# Patient Record
Sex: Female | Born: 1960 | Race: White | Hispanic: No | Marital: Married | Smoking: Never smoker
Health system: Southern US, Community
[De-identification: ages and names within clinical notes are randomized; demographics above are authoritative.]

## PROBLEM LIST (undated history)

## (undated) DIAGNOSIS — N809 Endometriosis, unspecified: Secondary | ICD-10-CM

## (undated) DIAGNOSIS — K5792 Diverticulitis of intestine, part unspecified, without perforation or abscess without bleeding: Secondary | ICD-10-CM

## (undated) DIAGNOSIS — K219 Gastro-esophageal reflux disease without esophagitis: Secondary | ICD-10-CM

## (undated) DIAGNOSIS — M1990 Unspecified osteoarthritis, unspecified site: Secondary | ICD-10-CM

## (undated) DIAGNOSIS — G43909 Migraine, unspecified, not intractable, without status migrainosus: Secondary | ICD-10-CM

## (undated) DIAGNOSIS — J301 Allergic rhinitis due to pollen: Secondary | ICD-10-CM

## (undated) HISTORY — PX: BREAST EXCISIONAL BIOPSY: SUR124

## (undated) HISTORY — DX: Unspecified osteoarthritis, unspecified site: M19.90

## (undated) HISTORY — DX: Migraine, unspecified, not intractable, without status migrainosus: G43.909

## (undated) HISTORY — DX: Diverticulitis of intestine, part unspecified, without perforation or abscess without bleeding: K57.92

## (undated) HISTORY — PX: APPENDECTOMY: SHX54

## (undated) HISTORY — DX: Endometriosis, unspecified: N80.9

## (undated) HISTORY — PX: CHOLECYSTECTOMY: SHX55

## (undated) HISTORY — PX: OTHER SURGICAL HISTORY: SHX169

## (undated) HISTORY — DX: Allergic rhinitis due to pollen: J30.1

## (undated) HISTORY — PX: ABDOMINAL HYSTERECTOMY: SHX81

---

## 1984-04-10 HISTORY — PX: APPENDECTOMY: SHX54

## 1986-04-10 HISTORY — PX: EXPLORATORY LAPAROTOMY: SUR591

## 1996-04-10 HISTORY — PX: ABDOMINAL HYSTERECTOMY: SHX81

## 1997-06-25 ENCOUNTER — Other Ambulatory Visit: Admission: RE | Admit: 1997-06-25 | Discharge: 1997-06-25 | Payer: Self-pay | Admitting: Obstetrics and Gynecology

## 1997-07-16 ENCOUNTER — Ambulatory Visit (HOSPITAL_COMMUNITY): Admission: RE | Admit: 1997-07-16 | Discharge: 1997-07-16 | Payer: Self-pay | Admitting: Obstetrics and Gynecology

## 1998-06-29 ENCOUNTER — Other Ambulatory Visit: Admission: RE | Admit: 1998-06-29 | Discharge: 1998-06-29 | Payer: Self-pay | Admitting: Obstetrics and Gynecology

## 1999-02-04 ENCOUNTER — Encounter: Payer: Self-pay | Admitting: Family Medicine

## 1999-02-04 ENCOUNTER — Encounter: Admission: RE | Admit: 1999-02-04 | Discharge: 1999-02-04 | Payer: Self-pay | Admitting: Family Medicine

## 1999-03-23 ENCOUNTER — Encounter: Admission: RE | Admit: 1999-03-23 | Discharge: 1999-03-23 | Payer: Self-pay | Admitting: Family Medicine

## 1999-03-23 ENCOUNTER — Encounter: Payer: Self-pay | Admitting: Family Medicine

## 1999-07-01 ENCOUNTER — Other Ambulatory Visit: Admission: RE | Admit: 1999-07-01 | Discharge: 1999-07-01 | Payer: Self-pay | Admitting: Obstetrics and Gynecology

## 1999-08-11 ENCOUNTER — Encounter: Payer: Self-pay | Admitting: Gastroenterology

## 1999-08-11 ENCOUNTER — Encounter: Admission: RE | Admit: 1999-08-11 | Discharge: 1999-08-11 | Payer: Self-pay | Admitting: Gastroenterology

## 2000-08-02 ENCOUNTER — Encounter: Payer: Self-pay | Admitting: Obstetrics and Gynecology

## 2000-08-02 ENCOUNTER — Encounter: Admission: RE | Admit: 2000-08-02 | Discharge: 2000-08-02 | Payer: Self-pay | Admitting: Obstetrics and Gynecology

## 2000-11-02 ENCOUNTER — Encounter: Admission: RE | Admit: 2000-11-02 | Discharge: 2000-11-02 | Payer: Self-pay | Admitting: Internal Medicine

## 2000-11-02 ENCOUNTER — Encounter: Payer: Self-pay | Admitting: Internal Medicine

## 2001-06-03 ENCOUNTER — Encounter: Payer: Self-pay | Admitting: Obstetrics and Gynecology

## 2001-06-03 ENCOUNTER — Encounter: Admission: RE | Admit: 2001-06-03 | Discharge: 2001-06-03 | Payer: Self-pay | Admitting: Obstetrics and Gynecology

## 2001-06-06 ENCOUNTER — Encounter: Payer: Self-pay | Admitting: Family Medicine

## 2001-06-06 ENCOUNTER — Encounter: Admission: RE | Admit: 2001-06-06 | Discharge: 2001-06-06 | Payer: Self-pay | Admitting: Family Medicine

## 2001-08-07 ENCOUNTER — Encounter: Admission: RE | Admit: 2001-08-07 | Discharge: 2001-08-07 | Payer: Self-pay | Admitting: Obstetrics and Gynecology

## 2001-08-07 ENCOUNTER — Encounter: Payer: Self-pay | Admitting: Obstetrics and Gynecology

## 2001-08-15 ENCOUNTER — Ambulatory Visit (HOSPITAL_COMMUNITY): Admission: RE | Admit: 2001-08-15 | Discharge: 2001-08-15 | Payer: Self-pay | Admitting: Obstetrics and Gynecology

## 2001-08-15 ENCOUNTER — Encounter: Payer: Self-pay | Admitting: Obstetrics and Gynecology

## 2002-08-19 ENCOUNTER — Encounter: Admission: RE | Admit: 2002-08-19 | Discharge: 2002-08-19 | Payer: Self-pay | Admitting: Obstetrics and Gynecology

## 2002-08-19 ENCOUNTER — Encounter: Payer: Self-pay | Admitting: Obstetrics and Gynecology

## 2002-11-26 ENCOUNTER — Encounter (INDEPENDENT_AMBULATORY_CARE_PROVIDER_SITE_OTHER): Payer: Self-pay | Admitting: *Deleted

## 2002-11-26 ENCOUNTER — Ambulatory Visit (HOSPITAL_BASED_OUTPATIENT_CLINIC_OR_DEPARTMENT_OTHER): Admission: RE | Admit: 2002-11-26 | Discharge: 2002-11-26 | Payer: Self-pay | Admitting: *Deleted

## 2003-04-11 HISTORY — PX: BREAST EXCISIONAL BIOPSY: SUR124

## 2003-08-18 ENCOUNTER — Emergency Department (HOSPITAL_COMMUNITY): Admission: EM | Admit: 2003-08-18 | Discharge: 2003-08-19 | Payer: Self-pay | Admitting: Emergency Medicine

## 2003-09-08 ENCOUNTER — Other Ambulatory Visit: Admission: RE | Admit: 2003-09-08 | Discharge: 2003-09-08 | Payer: Self-pay | Admitting: Obstetrics and Gynecology

## 2003-09-11 ENCOUNTER — Encounter: Admission: RE | Admit: 2003-09-11 | Discharge: 2003-09-11 | Payer: Self-pay | Admitting: Obstetrics and Gynecology

## 2004-11-04 ENCOUNTER — Other Ambulatory Visit: Admission: RE | Admit: 2004-11-04 | Discharge: 2004-11-04 | Payer: Self-pay | Admitting: Obstetrics and Gynecology

## 2004-11-15 ENCOUNTER — Ambulatory Visit (HOSPITAL_COMMUNITY): Admission: RE | Admit: 2004-11-15 | Discharge: 2004-11-15 | Payer: Self-pay | Admitting: Obstetrics and Gynecology

## 2004-11-24 ENCOUNTER — Encounter: Admission: RE | Admit: 2004-11-24 | Discharge: 2004-11-24 | Payer: Self-pay | Admitting: Obstetrics and Gynecology

## 2005-01-31 ENCOUNTER — Encounter: Admission: RE | Admit: 2005-01-31 | Discharge: 2005-01-31 | Payer: Self-pay | Admitting: *Deleted

## 2005-12-21 ENCOUNTER — Other Ambulatory Visit: Admission: RE | Admit: 2005-12-21 | Discharge: 2005-12-21 | Payer: Self-pay | Admitting: Obstetrics and Gynecology

## 2005-12-27 ENCOUNTER — Encounter: Admission: RE | Admit: 2005-12-27 | Discharge: 2005-12-27 | Payer: Self-pay | Admitting: Obstetrics and Gynecology

## 2006-12-31 ENCOUNTER — Encounter: Admission: RE | Admit: 2006-12-31 | Discharge: 2006-12-31 | Payer: Self-pay | Admitting: Obstetrics and Gynecology

## 2007-01-07 ENCOUNTER — Other Ambulatory Visit: Admission: RE | Admit: 2007-01-07 | Discharge: 2007-01-07 | Payer: Self-pay | Admitting: Obstetrics and Gynecology

## 2007-05-24 ENCOUNTER — Encounter: Admission: RE | Admit: 2007-05-24 | Discharge: 2007-05-24 | Payer: Self-pay | Admitting: Orthopaedic Surgery

## 2007-05-26 ENCOUNTER — Encounter: Admission: RE | Admit: 2007-05-26 | Discharge: 2007-05-26 | Payer: Self-pay | Admitting: Orthopaedic Surgery

## 2007-08-09 HISTORY — PX: OVARIAN CYST REMOVAL: SHX89

## 2007-08-27 ENCOUNTER — Ambulatory Visit (HOSPITAL_BASED_OUTPATIENT_CLINIC_OR_DEPARTMENT_OTHER): Admission: RE | Admit: 2007-08-27 | Discharge: 2007-08-27 | Payer: Self-pay | Admitting: Orthopaedic Surgery

## 2008-01-06 ENCOUNTER — Encounter: Admission: RE | Admit: 2008-01-06 | Discharge: 2008-01-06 | Payer: Self-pay | Admitting: Obstetrics and Gynecology

## 2008-01-15 ENCOUNTER — Other Ambulatory Visit: Admission: RE | Admit: 2008-01-15 | Discharge: 2008-01-15 | Payer: Self-pay | Admitting: Obstetrics and Gynecology

## 2008-04-10 HISTORY — PX: CHOLECYSTECTOMY: SHX55

## 2008-09-25 ENCOUNTER — Ambulatory Visit (HOSPITAL_BASED_OUTPATIENT_CLINIC_OR_DEPARTMENT_OTHER): Admission: RE | Admit: 2008-09-25 | Discharge: 2008-09-25 | Payer: Self-pay | Admitting: Family Medicine

## 2008-09-25 ENCOUNTER — Ambulatory Visit: Payer: Self-pay | Admitting: Diagnostic Radiology

## 2009-01-18 ENCOUNTER — Encounter: Admission: RE | Admit: 2009-01-18 | Discharge: 2009-01-18 | Payer: Self-pay | Admitting: Obstetrics and Gynecology

## 2009-01-20 ENCOUNTER — Other Ambulatory Visit
Admission: RE | Admit: 2009-01-20 | Discharge: 2009-01-20 | Payer: Self-pay | Admitting: Physical Medicine & Rehabilitation

## 2009-06-06 ENCOUNTER — Ambulatory Visit: Payer: Self-pay | Admitting: Interventional Radiology

## 2009-06-06 ENCOUNTER — Emergency Department (HOSPITAL_BASED_OUTPATIENT_CLINIC_OR_DEPARTMENT_OTHER): Admission: EM | Admit: 2009-06-06 | Discharge: 2009-06-07 | Payer: Self-pay | Admitting: Emergency Medicine

## 2009-06-07 ENCOUNTER — Ambulatory Visit: Payer: Self-pay | Admitting: Radiology

## 2009-06-08 DIAGNOSIS — K219 Gastro-esophageal reflux disease without esophagitis: Secondary | ICD-10-CM

## 2009-06-08 HISTORY — DX: Gastro-esophageal reflux disease without esophagitis: K21.9

## 2009-06-08 HISTORY — PX: ESOPHAGOGASTRODUODENOSCOPY: SHX1529

## 2009-06-17 ENCOUNTER — Ambulatory Visit (HOSPITAL_COMMUNITY): Admission: RE | Admit: 2009-06-17 | Discharge: 2009-06-17 | Payer: Self-pay | Admitting: Gastroenterology

## 2009-06-30 ENCOUNTER — Ambulatory Visit (HOSPITAL_COMMUNITY): Admission: RE | Admit: 2009-06-30 | Discharge: 2009-06-30 | Payer: Self-pay | Admitting: Gastroenterology

## 2009-07-26 ENCOUNTER — Encounter (INDEPENDENT_AMBULATORY_CARE_PROVIDER_SITE_OTHER): Payer: Self-pay | Admitting: General Surgery

## 2009-07-26 ENCOUNTER — Ambulatory Visit (HOSPITAL_COMMUNITY): Admission: RE | Admit: 2009-07-26 | Discharge: 2009-07-27 | Payer: Self-pay | Admitting: General Surgery

## 2010-01-19 ENCOUNTER — Encounter: Admission: RE | Admit: 2010-01-19 | Discharge: 2010-01-19 | Payer: Self-pay | Admitting: Obstetrics and Gynecology

## 2010-05-01 ENCOUNTER — Encounter: Payer: Self-pay | Admitting: Orthopaedic Surgery

## 2010-05-26 ENCOUNTER — Emergency Department (HOSPITAL_BASED_OUTPATIENT_CLINIC_OR_DEPARTMENT_OTHER)
Admission: EM | Admit: 2010-05-26 | Discharge: 2010-05-26 | Disposition: A | Discharge status: Home / Self Care | Payer: BC Managed Care – PPO | Attending: Emergency Medicine | Admitting: Emergency Medicine

## 2010-05-26 ENCOUNTER — Emergency Department (INDEPENDENT_AMBULATORY_CARE_PROVIDER_SITE_OTHER): Payer: BC Managed Care – PPO

## 2010-05-26 DIAGNOSIS — W1789XA Other fall from one level to another, initial encounter: Secondary | ICD-10-CM | POA: Insufficient documentation

## 2010-05-26 DIAGNOSIS — G8929 Other chronic pain: Secondary | ICD-10-CM | POA: Insufficient documentation

## 2010-05-26 DIAGNOSIS — M25539 Pain in unspecified wrist: Secondary | ICD-10-CM

## 2010-05-26 DIAGNOSIS — W172XXA Fall into hole, initial encounter: Secondary | ICD-10-CM

## 2010-05-26 DIAGNOSIS — M25529 Pain in unspecified elbow: Secondary | ICD-10-CM

## 2010-05-26 DIAGNOSIS — M549 Dorsalgia, unspecified: Secondary | ICD-10-CM | POA: Insufficient documentation

## 2010-05-26 DIAGNOSIS — S63509A Unspecified sprain of unspecified wrist, initial encounter: Secondary | ICD-10-CM | POA: Insufficient documentation

## 2010-06-29 LAB — LIPASE, BLOOD: Lipase: 45 U/L (ref 23–300)

## 2010-06-29 LAB — CBC
Hemoglobin: 14.4 g/dL (ref 12.0–15.0)
MCV: 91.4 fL (ref 78.0–100.0)
Platelets: 215 10*3/uL (ref 150–400)
Platelets: 203 10*3/uL (ref 150–400)

## 2010-06-29 LAB — COMPREHENSIVE METABOLIC PANEL
ALT: 16 U/L (ref 0–35)
ALT: 26 U/L (ref 0–35)
AST: 16 U/L (ref 0–37)
AST: 31 U/L (ref 0–37)
Albumin: 4 g/dL (ref 3.5–5.2)
Alkaline Phosphatase: 57 U/L (ref 39–117)
BUN: 10 mg/dL (ref 6–23)
Calcium: 9 mg/dL (ref 8.4–10.5)
Chloride: 107 mEq/L (ref 96–112)
Chloride: 105 mEq/L (ref 96–112)
Creatinine, Ser: 0.79 mg/dL (ref 0.4–1.2)
GFR calc Af Amer: >60 mL/min (ref >60)
GFR calc Af Amer: >60 mL/min (ref >60)
Potassium: 4 mEq/L (ref 3.5–5.1)
Potassium: 4.2 mEq/L (ref 3.5–5.1)
Sodium: 141 mEq/L (ref 135–145)
Total Bilirubin: 0.5 mg/dL (ref 0.3–1.2)
Total Bilirubin: 0.5 mg/dL (ref 0.3–1.2)

## 2010-06-29 LAB — DIFFERENTIAL
Eosinophils Relative: 1 % (ref 0–5)
Lymphocytes Relative: 29 % (ref 12–46)
Neutrophils Relative %: 61 % (ref 43–77)

## 2010-06-29 LAB — URINALYSIS, ROUTINE W REFLEX MICROSCOPIC
Bilirubin Urine: NEGATIVE
Glucose, UA: NEGATIVE mg/dL
Nitrite: NEGATIVE
Specific Gravity, Urine: 1.015 (ref 1.005–1.030)

## 2010-08-23 NOTE — Op Note (Signed)
Joanne [name], Joanne [name]                 ACCOUNT NO.:  [id number]   MEDICAL RECORD NO.:  [id number]          PATIENT TYPE:  AMB   LOCATION:  DSC                          FACILITY:  [facility]   PHYSICIAN:  Lubertha Basque. Dalldorf, M.D.DATE OF BIRTH:  [birth year]   DATE OF PROCEDURE:  08/27/2007  DATE OF DISCHARGE:                               OPERATIVE REPORT   PREOPERATIVE DIAGNOSIS:  Left middle finger flexor tendon sheath cyst.   POSTOPERATIVE DIAGNOSIS:  Left middle finger flexor tendon sheath cyst.   PROCEDURE:  Excision, left middle finger flexor tendon sheath cyst.   ANESTHESIA:  Bier block and MAC.   ATTENDING SURGEON:  Lubertha Basque. Jerl Santos, MD.   ASSISTANTLindwood Qua, PA.   INDICATIONS FOR PROCEDURE:  The patient is a 50 year old woman with a  long history of a cyst at the base of her left middle finger.  This made  grasping things difficult as it was directly central at her MCP joint.  This persisted despite oral anti-inflammatories and rest.  She is  offered excision.  Informed operative consent was obtained after  discussion of possible complications of reaction to anesthesia,  infection, neurovascular injury, and recurrence.   SUMMARY, FINDINGS, AND PROCEDURE:  Under Bier block and MAC through a  small central incision, an 8-mm diameter fluid-filled cyst was removed  from the volar aspect of the left middle finger MCP region.   DESCRIPTION OF PROCEDURE:  The patient was taken to an operating suite  where Bier block and MAC were applied.  She was positioned supine and  prepped and draped in normal sterile fashion.  After administration of  IV Kefzol, a small palmar incision was made from the MCP joint proximal.  This did not cross the flexion crease at the MCP joint.  Dissection was  carried down to the flexor tendon sheath.  She did have an 8 or 9-mm  fluid-filled cyst adherent to the flexor tendon sheath.  This was  removed along with the tiny portion of the underlying  flexor tendon  sheath.  The cyst was removed intact.  This was taken to a back table,  and cut open, and gelatinous fluid emanated.  This was consistent with a  benign ganglion cyst and we did not send the cyst to pathology.  The  underlying pulley remained intact.  The wound was irrigated followed by  reapproximation of the skin with nylon.  The tourniquet was deflated and  the fingertip became pink and warm immediately.  A dry gauze dressing  was applied along with a loose Ace wrap.  Estimated blood loss,  intraoperative fluids, as well as accurate tourniquet time could be  obtained from anesthesia records.   DISPOSITION:  The patient was taken to recovery room in stable addition.  She was to go home same day and follow up in the office next week.  I  will contact her by phone tonight.      Lubertha Basque Jerl Santos, M.D.  Electronically Signed     PGD/MEDQ  D:  08/27/2007  T:  08/28/2007  Job:  [id number]

## 2010-08-26 NOTE — Op Note (Signed)
   NAME:  Joanne [name], Joanne [name]                           ACCOUNT NO.:  [id number]   MEDICAL RECORD NO.:  [id number]                   PATIENT TYPE:  AMB   LOCATION:  DSC                                  FACILITY:  [facility]   PHYSICIAN:  Vikki Ports, M.D.         DATE OF BIRTH:  [birth year]   DATE OF PROCEDURE:  11/26/2002  DATE OF DISCHARGE:                                 OPERATIVE REPORT   PREOPERATIVE DIAGNOSIS:  Right breast mass.   POSTOPERATIVE DIAGNOSIS:  Right breast mass.   PROCEDURE:  Excisional right breast biopsy.   SURGEON:  Vikki Ports, M.D.   ANESTHESIA:  Local MAC.   DESCRIPTION OF PROCEDURE:  The patient was taken to the operating room and  placed in the supine position.  After adequate anesthesia was induced using  MAC technique, the right breast was prepped and draped in the normal sterile  fashion.  Using a 1% lidocaine local anesthesia, the skin overlying the 10  o'clock region of the right breast was anesthetized.  A curvilinear incision  was made and dissected down onto firm fibrotic tissue, which was excised in  its entirety.  Adequate hemostasis of the cavity was ensured.  The skin was  closed with subcuticular 4-0 Monocryl, Steri-Strips and sterile dressings  were applied.  The patient tolerated the procedure well and went to PACU in  good condition.                                               Vikki Ports, M.D.    KRH/MEDQ  D:  11/26/2002  T:  11/26/2002  Job:  [id number]

## 2010-12-19 ENCOUNTER — Other Ambulatory Visit: Payer: Self-pay | Admitting: Obstetrics and Gynecology

## 2010-12-19 DIAGNOSIS — Z1231 Encounter for screening mammogram for malignant neoplasm of breast: Secondary | ICD-10-CM

## 2011-01-04 LAB — POCT HEMOGLOBIN-HEMACUE: Hemoglobin: 15

## 2011-01-27 ENCOUNTER — Ambulatory Visit: Payer: BC Managed Care – PPO

## 2011-02-07 ENCOUNTER — Ambulatory Visit: Payer: BC Managed Care – PPO

## 2011-02-14 ENCOUNTER — Ambulatory Visit
Admission: RE | Admit: 2011-02-14 | Discharge: 2011-02-14 | Disposition: A | Discharge status: Home / Self Care | Payer: BC Managed Care – PPO | Source: Ambulatory Visit | Attending: Obstetrics and Gynecology | Admitting: Obstetrics and Gynecology

## 2011-02-14 DIAGNOSIS — Z1231 Encounter for screening mammogram for malignant neoplasm of breast: Secondary | ICD-10-CM

## 2011-02-21 ENCOUNTER — Other Ambulatory Visit: Payer: Self-pay | Admitting: Obstetrics and Gynecology

## 2011-04-13 IMAGING — MG MM DIGITAL SCREENING BILATERAL
4 series · 4 of 4 positions shown · non-contrast
Comparison: none

DG SCREEN MAMMOGRAM BILATERAL
Bilateral CC and MLO view(s) were taken.

DIGITAL SCREENING MAMMOGRAM WITH CAD:
The breast tissue is extremely dense.  No masses or malignant type calcifications are identified.  
Compared with prior studies.
Images were processed with CAD.

[R CC]
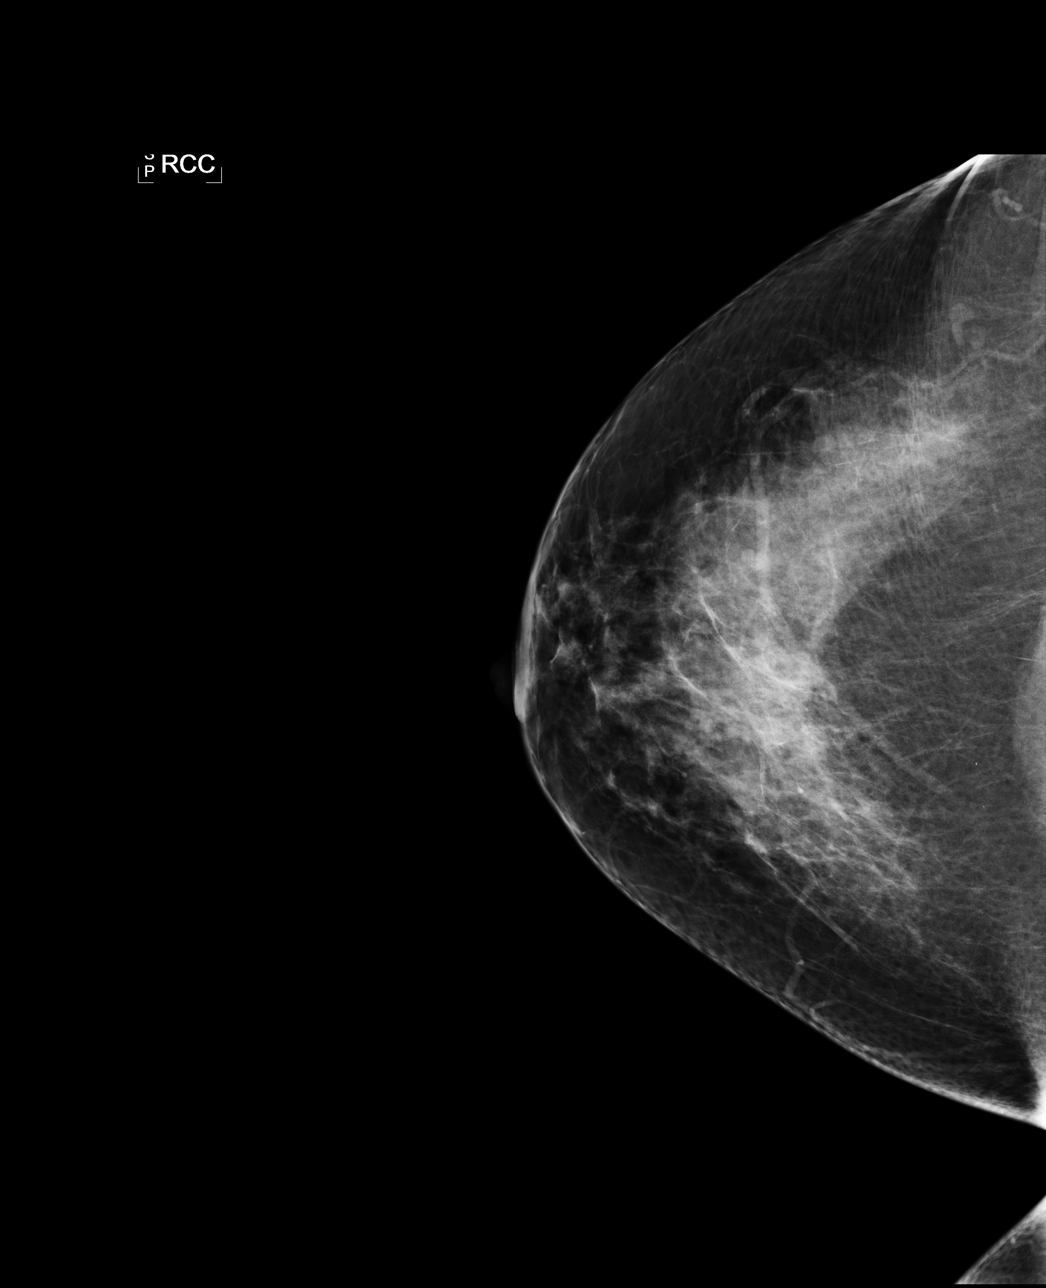

[L CC]
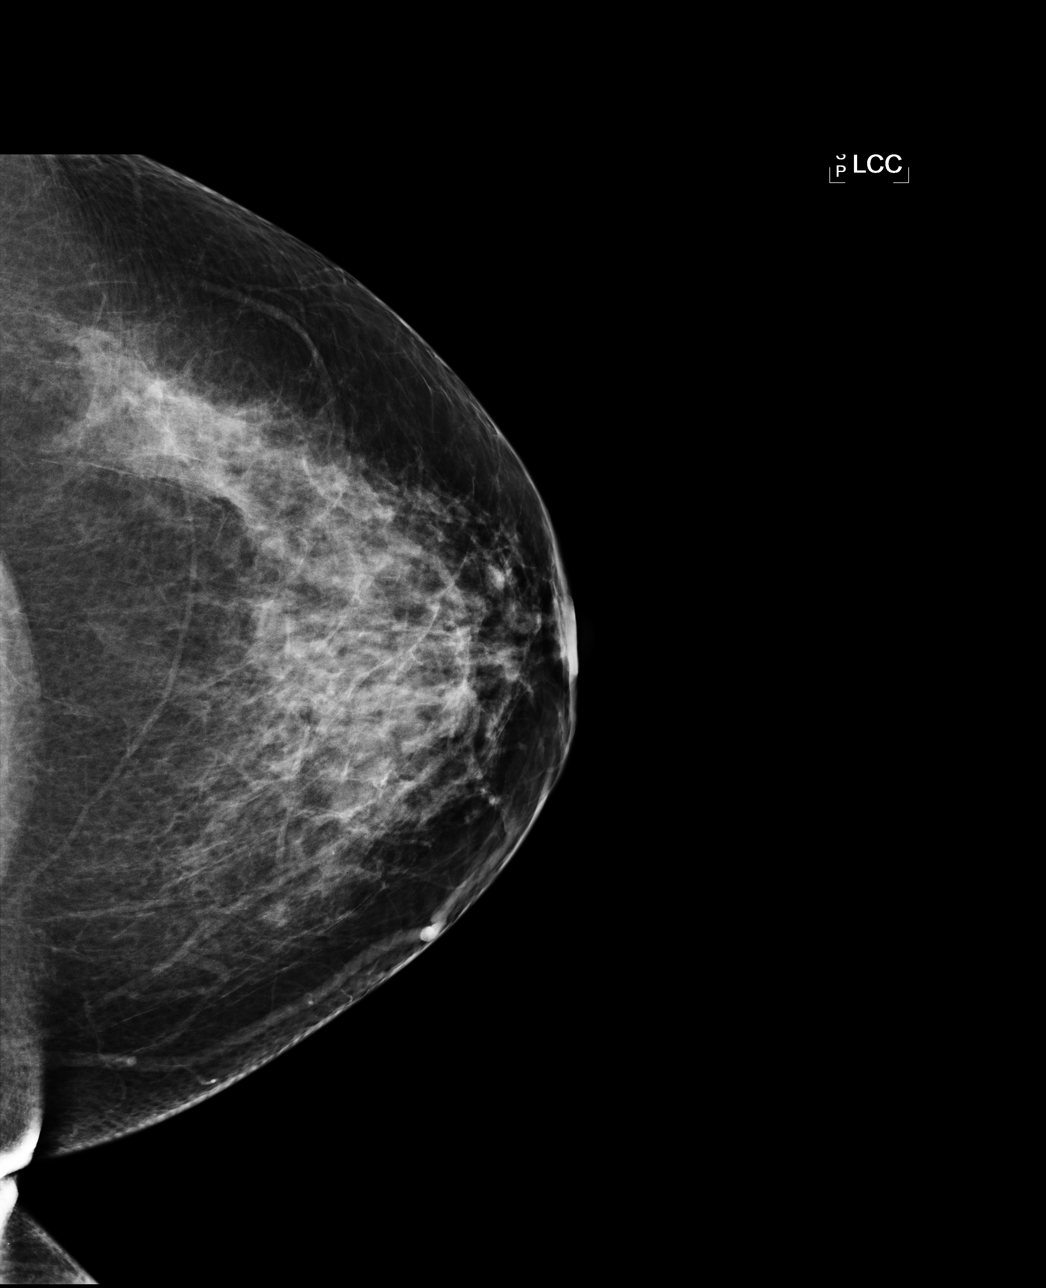

[L MLO]
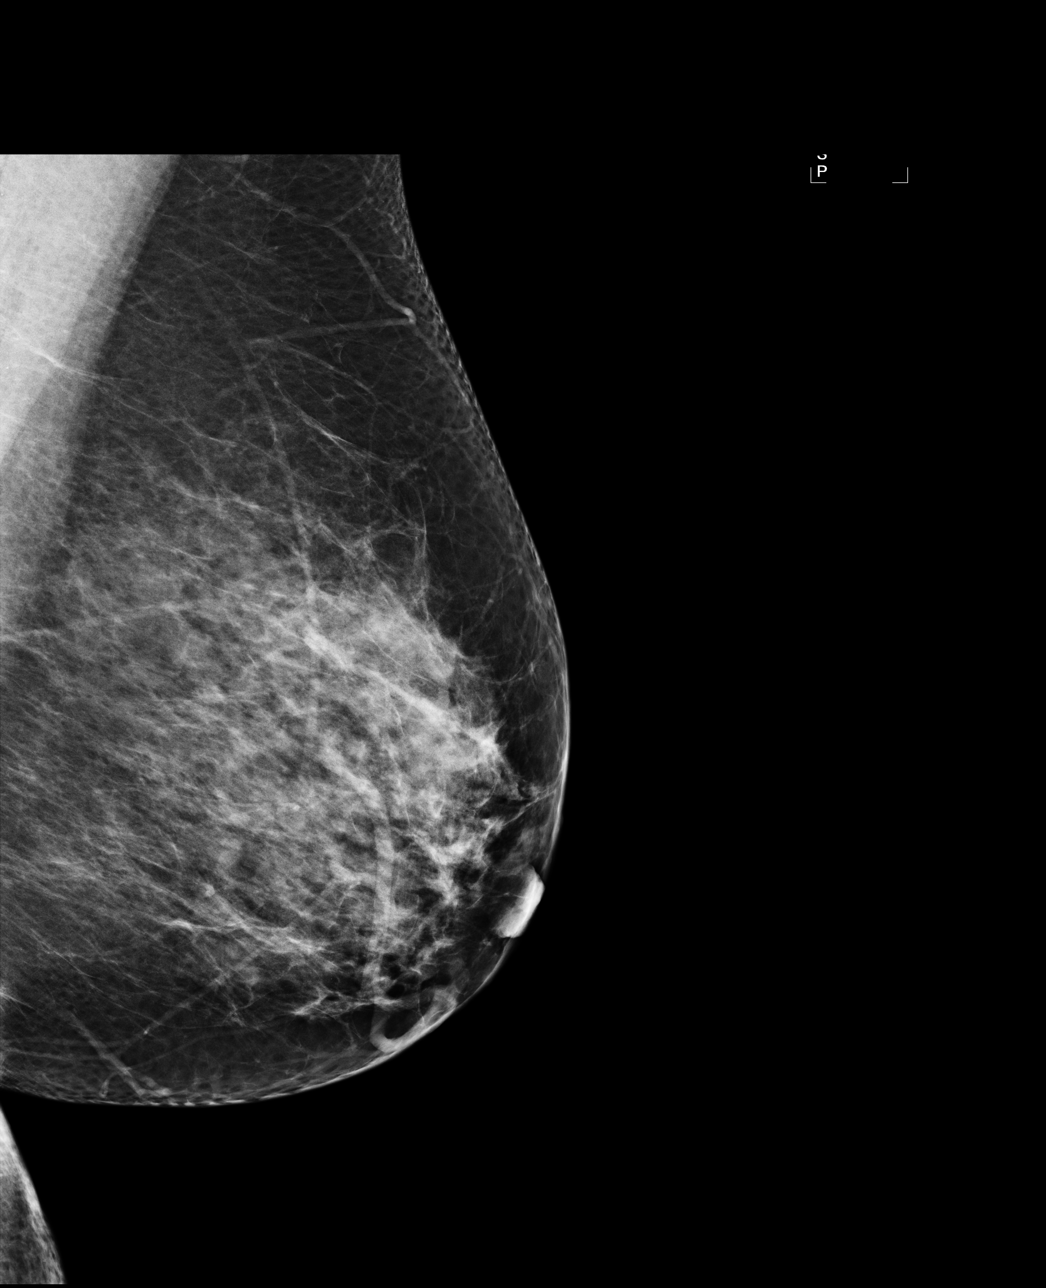

[R MLO]
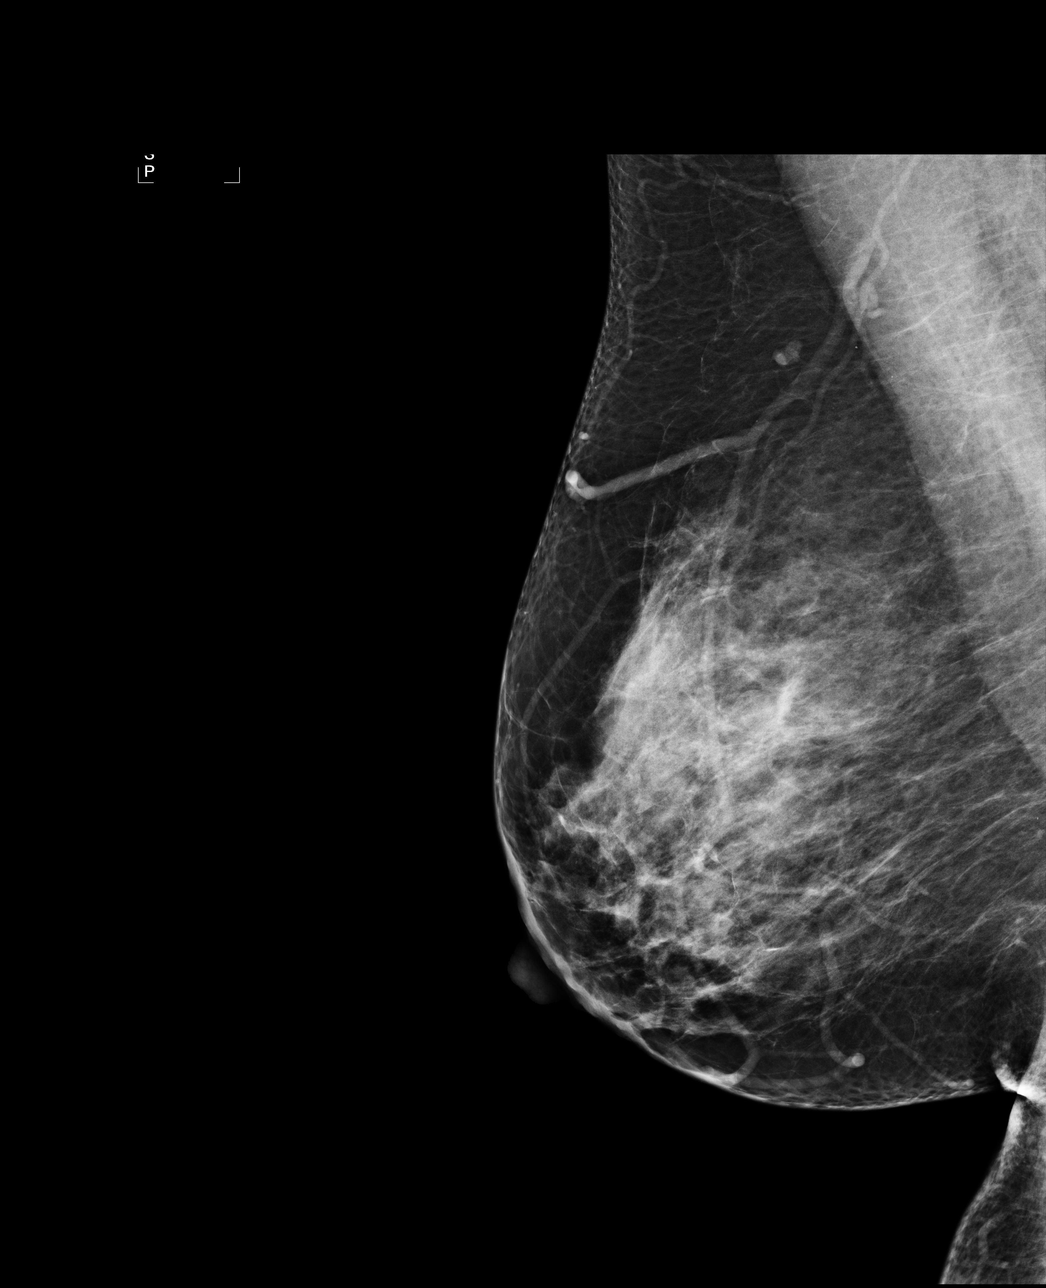

[4 of 4 positions shown; findings below may reference images not displayed]

IMPRESSION: No specific mammographic evidence of malignancy.  Next screening mammogram is recommended in one 
year.

A result letter of this screening mammogram will be mailed directly to the patient.

ASSESSMENT: Negative - BI-RADS 1

Screening mammogram in 1 year.
,

## 2011-09-26 ENCOUNTER — Emergency Department (HOSPITAL_BASED_OUTPATIENT_CLINIC_OR_DEPARTMENT_OTHER): Payer: BC Managed Care – PPO

## 2011-09-26 ENCOUNTER — Encounter (HOSPITAL_BASED_OUTPATIENT_CLINIC_OR_DEPARTMENT_OTHER): Payer: Self-pay

## 2011-09-26 ENCOUNTER — Emergency Department (HOSPITAL_BASED_OUTPATIENT_CLINIC_OR_DEPARTMENT_OTHER)
Admission: EM | Admit: 2011-09-26 | Discharge: 2011-09-26 | Disposition: A | Discharge status: Home / Self Care | Payer: BC Managed Care – PPO | Attending: Emergency Medicine | Admitting: Emergency Medicine

## 2011-09-26 DIAGNOSIS — W010XXA Fall on same level from slipping, tripping and stumbling without subsequent striking against object, initial encounter: Secondary | ICD-10-CM | POA: Insufficient documentation

## 2011-09-26 DIAGNOSIS — IMO0002 Reserved for concepts with insufficient information to code with codable children: Secondary | ICD-10-CM | POA: Insufficient documentation

## 2011-09-26 DIAGNOSIS — M25559 Pain in unspecified hip: Secondary | ICD-10-CM | POA: Insufficient documentation

## 2011-09-26 DIAGNOSIS — S76319A Strain of muscle, fascia and tendon of the posterior muscle group at thigh level, unspecified thigh, initial encounter: Secondary | ICD-10-CM

## 2011-09-26 DIAGNOSIS — M79609 Pain in unspecified limb: Secondary | ICD-10-CM | POA: Insufficient documentation

## 2011-09-26 HISTORY — DX: Gastro-esophageal reflux disease without esophagitis: K21.9

## 2011-09-26 MED ORDER — HYDROMORPHONE HCL PF 1 MG/ML IJ SOLN
1.0000 mg | Freq: Once | INTRAMUSCULAR | Status: AC
  Administered 2011-09-26: 1 mg via INTRAVENOUS
  Filled 2011-09-26: qty 1

## 2011-09-26 MED ORDER — IBUPROFEN 600 MG PO TABS: 600.0000 mg | ORAL_TABLET | Freq: Four times a day (QID) | ORAL | Status: AC | PRN

## 2011-09-26 MED ORDER — POLYETHYLENE GLYCOL 3350 17 GM/SCOOP PO POWD: 17.0000 g | Freq: Every day | ORAL | Status: AC

## 2011-09-26 MED ORDER — HYDROMORPHONE HCL PF 1 MG/ML IJ SOLN
1.0000 mg | INTRAMUSCULAR | Status: DC | PRN
  Administered 2011-09-26 (×2): 1 mg via INTRAVENOUS
  Filled 2011-09-26 (×2): qty 1

## 2011-09-26 MED ORDER — OXYCODONE-ACETAMINOPHEN 10-325 MG PO TABS: 1.0000 | ORAL_TABLET | ORAL | Status: AC | PRN

## 2011-09-26 MED ORDER — KETOROLAC TROMETHAMINE 30 MG/ML IJ SOLN
30.0000 mg | Freq: Once | INTRAMUSCULAR | Status: AC
  Administered 2011-09-26: 30 mg via INTRAVENOUS
  Filled 2011-09-26: qty 1

## 2011-09-26 NOTE — ED Notes (Signed)
Patient transported to X-ray via stretcher 

## 2011-09-26 NOTE — ED Notes (Signed)
Pt returned from MRI °

## 2011-09-26 NOTE — Discharge Instructions (Signed)
You have torn the hamstring from the ischium. Treatment is supportive: pain control and activity as tolerated. You may follow up with the orthopedic doctor in 1-2 weeks. Make sure to take miralax or stool softener while on pain medication.   Hamstring Strain  Hamstrings are the large muscles in the back of the thighs. A strain or tear injury happens when there is a sudden stretch or pull on these muscles and tendons. Tendons are cord like structures that attach muscle to bone. These injuries are commonly seen in activities such as sprinting due to sudden acceleration.  DIAGNOSIS  Often the diagnosis can be made by examination. HOME CARE INSTRUCTIONS   Apply ice to the sore area for 15 to , 3 to 4 times per day. Do this while awake for the first 2 days. Put the ice in a plastic bag, and place a towel between the bag of ice and your skin.   Keep your knee flexed when possible. This means your foot is held off the ground slightly if you are on crutches. When lying down, a pillow under the knee will take strain off the muscles and provide some relief.   If a compression bandage such as an ace wrap was applied, use it until you are seen again. You may remove it for sleeping, showers and baths. If the wrap seems to be too tight and is uncomfortable, wrap it more loosely. If your toes or foot are getting cold or blue, it is too tight.   Walk or move around as the pain allows, or as instructed. Resume full activities as suggested by your caregiver. This is often safest when the strength of the injured leg has nearly returned to normal.   Only take over-the-counter or prescription medicines for pain, discomfort, or fever as directed by your caregiver.  SEEK MEDICAL CARE IF:   You have an increase in bruising, swelling or pain.   You notice coldness or blueness of your toes or foot.   Pain relief is not obtained with medications.   You have increasing pain in the area and seem to be  getting worse rather than better.   You notice your thigh getting larger in size (this could indicate bleeding into the muscle).  Document Released: 12/20/2000 Document Revised: 03/16/2011 Document Reviewed: 03/29/2008 [facility] Patient Information 2012 [place], [place].

## 2011-09-26 NOTE — ED Notes (Signed)
MD at bedside. 

## 2011-09-26 NOTE — ED Notes (Signed)
Fell while opening a window and c/o right hip pain.

## 2011-09-26 NOTE — ED Notes (Signed)
Pt returned from radiology.

## 2011-09-26 NOTE — ED Notes (Signed)
Transported to mri.

## 2011-09-26 NOTE — ED Provider Notes (Signed)
History     CSN: [id number]  Arrival date & time 09/26/11  0846   First MD Initiated Contact with Patient 09/26/11 [phone]      Chief Complaint  Patient presents with  . Fall  . Hip Pain    (Consider location/radiation/quality/duration/timing/severity/associated sxs/prior treatment) HPI Comments: 51 yo female presents via EMS after a fall. Was standing with left leg in jacuzzi tub, reaching to close a window and slipped on water with legs doing the splits. Right leg and posterior buttock hit the ground. Unable to get up, called EMS who gave fentanyl in route. Having severe pain from posterior right buttock radiating down back of thigh muscle and calf, leg feels heavy Denies any fevers, bony tenderness, back/neck pain, numbness, swelling.   No history of steroid use, osteoporosis, bone pathology.     Past Medical History  Diagnosis Date  . GERD (gastroesophageal reflux disease)     Past Surgical History  Procedure Date  . Cholecystectomy   . Abdominal hysterectomy   . Laproscopy   . Appendectomy     No family history on file.  History  Substance Use Topics  . Smoking status: Never Smoker   . Smokeless tobacco: Not on file  . Alcohol Use: Yes     occasional    OB History    Grav Para Term Preterm Abortions TAB SAB Ect Mult Living                  Review of Systems  Constitutional: Negative for fever.  HENT: Negative for neck pain.   Respiratory: Negative for shortness of breath.   Cardiovascular: Negative for leg swelling.  Gastrointestinal: Negative for vomiting.  Genitourinary: Negative for flank pain.  Musculoskeletal: Negative for back pain.  Neurological: Negative for dizziness.  All other systems reviewed and are negative.    Allergies  Tape  Home Medications   Current Outpatient Rx  Name Route Sig Dispense Refill  . OMEPRAZOLE 20 MG PO CPDR Oral Take 20 mg by mouth daily.      BP 121/52  Pulse 83  Temp 97.4 F (36.3 C) (Oral)  Resp 20   Ht 5\' 9"  (1.753 m)  Wt 250 lb (113.399 kg)  BMI 36.92 kg/m2  SpO2 100%  Physical Exam  Vitals reviewed. Constitutional: She is oriented to person, place, and time. She appears well-nourished.       Very uncomfortable, lying flat in bed.   HENT:  Head: Normocephalic and atraumatic.  Pulmonary/Chest: Effort normal.  Abdominal: Soft.  Musculoskeletal: She exhibits tenderness. She exhibits no edema.       ROM limited by pain. Landmarks somewhat obscurred by obesity. TTP in right ischial tuberosity area, hamstring and calf, anterior groin area.  No trochanteric or knee TTP. No bony deformity. No spinous tenderness pain. Negative SLT and crossed leg test. No edema, erythema. 2+ bilateral DP pulses.  Neurological: She is alert and oriented to person, place, and time. Coordination normal.       Leg strength testing limited by pain. Sensation intact.  Skin: No rash noted.    ED Course  Procedures (including critical care time)  Labs Reviewed - No data to display Dg Hip Complete Right  09/26/2011  *RADIOLOGY REPORT*  Clinical Data: Right hip pain status post fall.  RIGHT HIP - COMPLETE 2+ VIEW  Comparison: Contemporaneous femur radiographs, 06/06/2009 CT  Findings: The femoral head remains seated within the acetabulum. There is mild degenerative change.  No displaced fracture or dislocation.  No aggressive osseous lesions.  The pubic rami are intact.  The sacrum is obscured by overlying bowel.  Pubic symphysis DJD.  IMPRESSION: No acute osseous abnormality.  If clinical concern for a nondisplaced fracture persists, MR is the study of choice.  Original Report Authenticated By: Waneta Martins, M.D.   Dg Femur Right  09/26/2011  *RADIOLOGY REPORT*  Clinical Data: Right hip pain status post fall.  RIGHT FEMUR - 2 VIEW  Comparison: Contemporaneous hip radiographs  Findings: No femoral shaft fracture identified.  No aggressive osseous lesion.  IMPRESSION: No femoral shaft fracture identified.   Original Report Authenticated By: Waneta Martins, M.D.   Mr Hip Right Wo Contrast  09/26/2011  *RADIOLOGY REPORT*  Clinical Data: Severe right hip pain and limited range of motion secondary to a fall.  MRI OF THE RIGHT HIP WITHOUT CONTRAST  Technique:  Multiplanar, multisequence MR imaging was performed. No intravenous contrast was administered.  Comparison: Radiographs dated 09/26/2011  Findings: There is complete acute avulsion of the origins of the right hamstring tendons including the conjoined tendon of the semitendinosus and biceps femoris muscles as well as the semimembranosus tendon from the right ischial tuberosity. The tendons are distally retracted 2.6 cm.  There is extensive edema in the adjacent soft tissues.  There is also a partial tear of the right gluteus maximus muscle.  Osseous structures are normal.  There are two small cysts in the left ovary.  Uterus has been removed.  IMPRESSION: Complete acute avulsion of the origins of the right hamstring tendons from the right ischium.  Partial tear of the adjacent gluteus maximus muscle.  Original Report Authenticated By: Gwynn Burly, M.D.     1. Hamstring tendon rupture      MDM  51 yo female with right buttock/leg pain after a fall with hyperflexion of hip. Severe pain limits exam, will attempt re-examination after analgesia. In absence of hip pathology on plain film, likely a ligamentous sprain and ischeal contusion.   11:41 AM After multiple doses IV analgesia and toradol, patient appears comfortable; however with the slightest attempt at ROM exam, patient screams in agony. Will obtain MRI to assess for occult fracture vs ligamentous/muscular pathology.   2:54 PM  Pain control, still patient unable to ambulate. MRI reveals hamstring avulsion. Case d/w Dr. Rennis Chris with orthopedics who recommended supportive care, activity as tolerated, pain control. Not a surgical issue, likely to heal given degree of displacement. She may f/u  in clinic in 1-2 weeks. These recommendations discussed with patient. She is unable to ambulate currently and cannot bend her leg, but has assistive devices at home including bedside commode and walker (from husband's hip replacement operation), will arrange ambulance to transport. Analgesia, anti-inflammatories and bowel regimen discussed.      Durwin Reges, MD 09/26/11 [phone]

## 2011-09-26 NOTE — ED Notes (Signed)
Called [facility] to transport patient back to her home because of a torn (complete) hamstring.

## 2011-09-26 NOTE — ED Provider Notes (Signed)
I saw and evaluated the patient, reviewed the resident's note and I agree with the findings and plan.   .Face to face Exam:  General:  Awake HEENT:  Atraumatic Resp:  Normal effort Abd:  Nondistended Neuro:No focal weakness Lymph: No adenopathy   Nelia Shi, MD 09/26/11 2259

## 2011-09-26 NOTE — ED Notes (Signed)
MRI Tech at bedside.

## 2012-01-16 ENCOUNTER — Other Ambulatory Visit: Payer: Self-pay | Admitting: Obstetrics and Gynecology

## 2012-01-16 DIAGNOSIS — Z139 Encounter for screening, unspecified: Secondary | ICD-10-CM

## 2012-02-20 ENCOUNTER — Ambulatory Visit (INDEPENDENT_AMBULATORY_CARE_PROVIDER_SITE_OTHER): Payer: BC Managed Care – PPO

## 2012-02-20 DIAGNOSIS — Z1231 Encounter for screening mammogram for malignant neoplasm of breast: Secondary | ICD-10-CM

## 2012-02-20 DIAGNOSIS — Z139 Encounter for screening, unspecified: Secondary | ICD-10-CM

## 2012-04-10 DIAGNOSIS — S76309A Unspecified injury of muscle, fascia and tendon of the posterior muscle group at thigh level, unspecified thigh, initial encounter: Secondary | ICD-10-CM

## 2012-04-10 HISTORY — DX: Unspecified injury of muscle, fascia and tendon of the posterior muscle group at thigh level, unspecified thigh, initial encounter: S76.309A

## 2013-02-05 ENCOUNTER — Other Ambulatory Visit: Payer: Self-pay | Admitting: Obstetrics and Gynecology

## 2013-02-05 DIAGNOSIS — Z1231 Encounter for screening mammogram for malignant neoplasm of breast: Secondary | ICD-10-CM

## 2013-03-11 ENCOUNTER — Ambulatory Visit: Payer: BC Managed Care – PPO

## 2013-03-11 DIAGNOSIS — Z1231 Encounter for screening mammogram for malignant neoplasm of breast: Secondary | ICD-10-CM

## 2014-02-06 ENCOUNTER — Other Ambulatory Visit: Payer: Self-pay | Admitting: Obstetrics and Gynecology

## 2014-02-06 DIAGNOSIS — Z139 Encounter for screening, unspecified: Secondary | ICD-10-CM

## 2014-03-25 ENCOUNTER — Ambulatory Visit: Payer: BC Managed Care – PPO

## 2014-03-25 DIAGNOSIS — Z139 Encounter for screening, unspecified: Secondary | ICD-10-CM

## 2015-02-16 ENCOUNTER — Other Ambulatory Visit: Payer: Self-pay | Admitting: Obstetrics and Gynecology

## 2015-02-16 DIAGNOSIS — Z1231 Encounter for screening mammogram for malignant neoplasm of breast: Secondary | ICD-10-CM

## 2015-03-09 ENCOUNTER — Other Ambulatory Visit: Payer: Self-pay | Admitting: Obstetrics and Gynecology

## 2015-03-09 DIAGNOSIS — N644 Mastodynia: Secondary | ICD-10-CM

## 2015-03-29 ENCOUNTER — Ambulatory Visit
Admission: RE | Admit: 2015-03-29 | Discharge: 2015-03-29 | Disposition: A | Discharge status: Home / Self Care | Payer: BC Managed Care – PPO | Source: Ambulatory Visit | Attending: Obstetrics and Gynecology | Admitting: Obstetrics and Gynecology

## 2015-03-29 ENCOUNTER — Ambulatory Visit (HOSPITAL_BASED_OUTPATIENT_CLINIC_OR_DEPARTMENT_OTHER): Payer: BC Managed Care – PPO

## 2015-03-29 DIAGNOSIS — N644 Mastodynia: Secondary | ICD-10-CM

## 2016-03-28 ENCOUNTER — Ambulatory Visit: Payer: BC Managed Care – PPO

## 2016-03-28 DIAGNOSIS — Z1231 Encounter for screening mammogram for malignant neoplasm of breast: Secondary | ICD-10-CM

## 2016-05-11 ENCOUNTER — Other Ambulatory Visit: Payer: Self-pay | Admitting: Obstetrics and Gynecology

## 2016-05-11 DIAGNOSIS — Z803 Family history of malignant neoplasm of breast: Secondary | ICD-10-CM

## 2016-05-26 ENCOUNTER — Ambulatory Visit
Admission: RE | Admit: 2016-05-26 | Discharge: 2016-05-26 | Disposition: A | Discharge status: Home / Self Care | Payer: BC Managed Care – PPO | Source: Ambulatory Visit | Attending: Obstetrics and Gynecology | Admitting: Obstetrics and Gynecology

## 2016-05-26 DIAGNOSIS — Z803 Family history of malignant neoplasm of breast: Secondary | ICD-10-CM

## 2016-05-26 MED ORDER — GADOBENATE DIMEGLUMINE 529 MG/ML IV SOLN
20.0000 mL | Freq: Once | INTRAVENOUS | Status: AC | PRN
  Administered 2016-05-26: 20 mL via INTRAVENOUS

## 2016-05-31 ENCOUNTER — Other Ambulatory Visit: Payer: Self-pay | Admitting: Obstetrics and Gynecology

## 2016-05-31 DIAGNOSIS — N63 Unspecified lump in unspecified breast: Secondary | ICD-10-CM

## 2016-06-13 ENCOUNTER — Ambulatory Visit
Admission: RE | Admit: 2016-06-13 | Discharge: 2016-06-13 | Disposition: A | Discharge status: Home / Self Care | Payer: BC Managed Care – PPO | Source: Ambulatory Visit | Attending: Obstetrics and Gynecology | Admitting: Obstetrics and Gynecology

## 2016-06-13 DIAGNOSIS — N63 Unspecified lump in unspecified breast: Secondary | ICD-10-CM

## 2016-06-14 ENCOUNTER — Other Ambulatory Visit: Payer: Self-pay | Admitting: Obstetrics and Gynecology

## 2016-06-14 DIAGNOSIS — R928 Other abnormal and inconclusive findings on diagnostic imaging of breast: Secondary | ICD-10-CM

## 2016-06-30 ENCOUNTER — Other Ambulatory Visit: Payer: Self-pay | Admitting: Obstetrics and Gynecology

## 2016-06-30 ENCOUNTER — Ambulatory Visit
Admission: RE | Admit: 2016-06-30 | Discharge: 2016-06-30 | Disposition: A | Discharge status: Home / Self Care | Payer: BC Managed Care – PPO | Source: Ambulatory Visit | Attending: Obstetrics and Gynecology | Admitting: Obstetrics and Gynecology

## 2016-06-30 DIAGNOSIS — R928 Other abnormal and inconclusive findings on diagnostic imaging of breast: Secondary | ICD-10-CM

## 2016-06-30 MED ORDER — GADOBENATE DIMEGLUMINE 529 MG/ML IV SOLN
20.0000 mL | Freq: Once | INTRAVENOUS | Status: AC | PRN
  Administered 2016-06-30: 20 mL via INTRAVENOUS

## 2017-01-16 ENCOUNTER — Other Ambulatory Visit (HOSPITAL_BASED_OUTPATIENT_CLINIC_OR_DEPARTMENT_OTHER): Payer: Self-pay | Admitting: Physician Assistant

## 2017-01-16 DIAGNOSIS — R109 Unspecified abdominal pain: Secondary | ICD-10-CM

## 2017-01-19 ENCOUNTER — Ambulatory Visit (HOSPITAL_BASED_OUTPATIENT_CLINIC_OR_DEPARTMENT_OTHER)
Admission: RE | Admit: 2017-01-19 | Discharge: 2017-01-19 | Disposition: A | Discharge status: Home / Self Care | Payer: BC Managed Care – PPO | Source: Ambulatory Visit | Attending: Physician Assistant | Admitting: Physician Assistant

## 2017-01-19 ENCOUNTER — Encounter (HOSPITAL_BASED_OUTPATIENT_CLINIC_OR_DEPARTMENT_OTHER): Payer: Self-pay

## 2017-01-19 DIAGNOSIS — R109 Unspecified abdominal pain: Secondary | ICD-10-CM | POA: Diagnosis present

## 2017-01-19 DIAGNOSIS — K7689 Other specified diseases of liver: Secondary | ICD-10-CM | POA: Insufficient documentation

## 2017-01-19 MED ORDER — IOPAMIDOL (ISOVUE-300) INJECTION 61%
100.0000 mL | Freq: Once | INTRAVENOUS | Status: AC | PRN
  Administered 2017-01-19: 100 mL via INTRAVENOUS

## 2017-03-29 ENCOUNTER — Ambulatory Visit (INDEPENDENT_AMBULATORY_CARE_PROVIDER_SITE_OTHER): Payer: BC Managed Care – PPO

## 2017-03-29 ENCOUNTER — Ambulatory Visit
Admission: EM | Admit: 2017-03-29 | Discharge: 2017-03-29 | Disposition: A | Discharge status: Home / Self Care | Payer: BC Managed Care – PPO | Attending: Family Medicine | Admitting: Family Medicine

## 2017-03-29 ENCOUNTER — Other Ambulatory Visit: Payer: Self-pay

## 2017-03-29 DIAGNOSIS — R062 Wheezing: Secondary | ICD-10-CM | POA: Diagnosis not present

## 2017-03-29 DIAGNOSIS — R059 Cough, unspecified: Secondary | ICD-10-CM

## 2017-03-29 DIAGNOSIS — K219 Gastro-esophageal reflux disease without esophagitis: Secondary | ICD-10-CM | POA: Insufficient documentation

## 2017-03-29 DIAGNOSIS — J9801 Acute bronchospasm: Secondary | ICD-10-CM | POA: Insufficient documentation

## 2017-03-29 DIAGNOSIS — R05 Cough: Secondary | ICD-10-CM | POA: Diagnosis not present

## 2017-03-29 DIAGNOSIS — R0789 Other chest pain: Secondary | ICD-10-CM | POA: Insufficient documentation

## 2017-03-29 DIAGNOSIS — T7840XA Allergy, unspecified, initial encounter: Secondary | ICD-10-CM | POA: Diagnosis not present

## 2017-03-29 DIAGNOSIS — R0602 Shortness of breath: Secondary | ICD-10-CM | POA: Insufficient documentation

## 2017-03-29 DIAGNOSIS — Z9109 Other allergy status, other than to drugs and biological substances: Secondary | ICD-10-CM

## 2017-03-29 MED ORDER — IPRATROPIUM-ALBUTEROL 0.5-2.5 (3) MG/3ML IN SOLN
3.0000 mL | Freq: Once | RESPIRATORY_TRACT | Status: AC
Start: 2017-03-29 — End: 2017-03-29
  Administered 2017-03-29: 3 mL via RESPIRATORY_TRACT

## 2017-03-29 MED ORDER — ALBUTEROL SULFATE HFA 108 (90 BASE) MCG/ACT IN AERS
2.0000 | INHALATION_SPRAY | RESPIRATORY_TRACT | 0 refills | Status: AC | PRN
Start: 2017-03-29 — End: ?

## 2017-03-29 MED ORDER — METHYLPREDNISOLONE SODIUM SUCC 125 MG IJ SOLR
125.0000 mg | Freq: Once | INTRAMUSCULAR | Status: AC
  Administered 2017-03-29: 125 mg via INTRAMUSCULAR

## 2017-03-29 MED ORDER — PREDNISONE 20 MG PO TABS: 40.0000 mg | ORAL_TABLET | Freq: Every day | ORAL | 0 refills | Status: AC

## 2017-03-29 NOTE — ED Provider Notes (Addendum)
[facility] ____________________________________________  Time seen: Approximately 2:44 PM  I have reviewed the triage vital signs and the nursing notes.   HISTORY  Chief Complaint Shortness of Breath and Cough   HPI Joanne [name] is a 56 y.o. female presenting for evaluation of onset of cough, shortness of breath and wheezing that started just prior to arrival.  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  Reports there is a lot of wheat dust throughout the factory.  States son claims a "he just breathes wheat. "Patient reports that while she was at the brewery she felt fine.  States however within 5 minutes of getting in the car and leaving she had sudden onset of coughing that kept continuing.  States with the cough she would feel short of breath, hear wheezing and some chest pressure.  States symptoms are only present with coughing.  Reports prior to the above she felt fine.  Denies any recent runny nose, nasal congestion sore throat, fevers, chest pain, shortness of breath, hemoptysis or other complaints.  Denies history of similar in the past.  Denies any history of allergies, asthma or wheezing.  Patient reports otherwise she feels fine.  No alleviating measures attempted prior to arrival.  Denies other aggravating or alleviating factors. Denies abdominal pain, dysuria, extremity pain, extremity swelling or rash. Denies recent sickness. Denies recent antibiotic use.  Denies cardiac history.  Denies diabetes, renal insufficiency.  Joycelyn RuaMeyers, Stephen, MD: PCP   Past Medical History:  Diagnosis Date  . GERD (gastroesophageal reflux disease)     There are no active problems to display for this patient.   Past Surgical History:  Procedure Laterality Date  . ABDOMINAL HYSTERECTOMY    . APPENDECTOMY    . CHOLECYSTECTOMY    . laproscopy       No current facility-administered  medications for this encounter.   Current Outpatient Medications:  .  albuterol (PROVENTIL HFA;VENTOLIN HFA) 108 (90 Base) MCG/ACT inhaler, Inhale 2 puffs into the lungs every 4 (four) hours as needed for wheezing or shortness of breath., Disp: 1 Inhaler, Rfl: 0 .  cetirizine (ZYRTEC) 10 MG tablet, Take 10 mg by mouth daily. Patient uses this medication for her allergies., Disp: , Rfl:  .  estradiol (VIVELLE-DOT) 0.025 MG/24HR, Place 1 patch onto the skin 2 (two) times a week., Disp: , Rfl:  .  omeprazole (PRILOSEC) 20 MG capsule, Take 20 mg by mouth daily., Disp: , Rfl:  .  predniSONE (DELTASONE) 20 MG tablet, Take 2 tablets (40 mg total) by mouth daily for 3 days., Disp: 6 tablet, Rfl: 0  Allergies Tape  family history Father: MI  Social History Social History   Tobacco Use  . Smoking status: Never Smoker  Substance Use Topics  . Alcohol use: Yes    Comment: occasional  . Drug use: No    Review of Systems Constitutional: No fever/chills ENT: No sore throat. Cardiovascular: Denies chest pain. Respiratory:  As above.  Gastrointestinal: No abdominal pain.   Genitourinary: Negative for dysuria. Musculoskeletal: Negative for back pain. Skin: Negative for rash. Neurological: Negative for headaches, focal weakness or numbness.    ____________________________________________   PHYSICAL EXAM:  VITAL SIGNS: ED Triage Vitals  Enc Vitals Group     BP 03/29/17 1424 (!) 159/108     Pulse Rate 03/29/17 1424 78     Resp 03/29/17 1424 20  Temp 03/29/17 1424 98.7 F (37.1 C)     Temp Source 03/29/17 1424 Oral     SpO2 03/29/17 1424 100 %     Weight 03/29/17 1425 242 lb (109.8 kg)     Height 03/29/17 1425 5\' 9"  (1.753 m)     Head Circumference --      Peak Flow --      Pain Score 03/29/17 1425 0     Pain Loc --      Pain Edu? --      Excl. in GC? --    Vitals:   03/29/17 1424 03/29/17 1425 03/29/17 1605  BP: (!) 159/108  134/83  Pulse: 78  86  Resp: 20  20  Temp:  98.7 F (37.1 C)    TempSrc: Oral    SpO2: 100%  99%  Weight:  242 lb (109.8 kg)   Height:  5\' 9"  (1.753 m)     Constitutional: Alert and oriented. Well appearing and in no acute distress. Eyes: Conjunctivae are normal.  Head: Atraumatic. No sinus tenderness to palpation. No swelling. No erythema.  Ears: no erythema, normal TMs bilaterally.   Nose:No nasal congestion  Mouth/Throat: Mucous membranes are moist. No pharyngeal erythema. No tonsillar swelling or exudate.  Neck: No stridor.  No cervical spine tenderness to palpation. Hematological/Lymphatic/Immunilogical: No cervical lymphadenopathy. Cardiovascular: Normal rate, regular rhythm. Grossly normal heart sounds.  Good peripheral circulation. Respiratory: Normal respiratory effort.  No retractions.  No rhonchi or rales.  Mild diffuse inspiratory and expiratory wheezes.  Frequent dry cough in room with bronchospasm. Gastrointestinal: Soft and nontender. Musculoskeletal: Ambulatory with steady gait. No cervical, thoracic or lumbar tenderness to palpation.  Bilateral lower extremities nontender and no edema noted. Neurologic:  Normal speech and language. No gait instability.  Bilateral hand grip strong and equal.  No paresthesias noted bilaterally. Skin:  Skin appears warm, dry and intact. No rash noted. Psychiatric: Mood and affect are normal. Speech and behavior are normal.  ___________________________________________   LABS (all labs ordered are listed, but only abnormal results are displayed)  Labs Reviewed - No data to display ____________________________________________  EKG  ED ECG REPORT I, Renford Dills, the attending provider, personally viewed and interpreted this ECG.   Date: 03/29/2017  EKG Time: 1428  Rate: 78  Rhythm:  normal sinus rhythm  Axis: normal  Intervals:none  ST&T Change: no ST or T wave elevation noted.  No previous EKG found for  comparison.  ____________________________________________  RADIOLOGY  Dg Chest 2 View  Result Date: 03/29/2017 CLINICAL DATA:  Cough, shortness of breath, and wheezing. EXAM: CHEST  2 VIEW COMPARISON:  06/11/2009 FINDINGS: The heart size and mediastinal contours are within normal limits. Both lungs are clear. The visualized skeletal structures are unremarkable. IMPRESSION: Normal exam. Electronically Signed   By: Francene Boyers M.D.   On: 03/29/2017 15:07   ____________________________________________   PROCEDURES Procedures   INITIAL IMPRESSION / ASSESSMENT AND PLAN / ED COURSE  Pertinent labs & imaging results that were available during my care of the patient were reviewed by me and considered in my medical decision making (see chart for details).  Well-appearing patient.  Presenting for acute onset of cough with bronchospasm and shortness of breath that occurred just after leaving her son's new employment that has a lot of wheat and malt exposure.  Suspect allergic bronchospasm.  IM Solu-Medrol 125mg  and 2 duo nebs given in urgent care and will continue to monitor.  After first DuoNeb and Solu-Medrol, patient  reports already feeling better and with improved feeling better after second DuoNeb.  Chest x-ray unremarkable per radiologist.  Patient reevaluated and no longer wheezing or bronchospasm with coughing.  Patient sitting in bed and resting calmly.  Patient reports much improved and will continue to monitor at this time.  Patient reexamined and continues to feel better.  Lungs clear throughout.  No coughing noted and patient states coughing has resolved.  Recommend Claritin orally over-the-counter for 1 week.  Will Rx 3-day course of prednisone and as needed albuterol inhaler.  Discussed strict avoidance of the above trigger.  Encourage rest, fluids, supportive care.  Discussed strict follow-up and return parameters.  Discussed follow up with Primary care physician this week.  Discussed follow up and return parameters including no resolution or any worsening concerns. Patient verbalized understanding and agreed to plan.   ____________________________________________   FINAL CLINICAL IMPRESSION(S) / ED DIAGNOSES  Final diagnoses:  Bronchospasm, acute  Allergy to environmental factors  Cough     ED Discharge Orders        Ordered    predniSONE (DELTASONE) 20 MG tablet  Daily     03/29/17 1550    albuterol (PROVENTIL HFA;VENTOLIN HFA) 108 (90 Base) MCG/ACT inhaler  Every 4 hours PRN     03/29/17 1550       Note: This dictation was prepared with Dragon dictation along with smaller phrase technology. Any transcriptional errors that result from this process are unintentional.           Renford DillsMiller, Tyreek Clabo, NP 03/29/17 1612

## 2017-03-29 NOTE — Discharge Instructions (Signed)
Take medication as prescribed. Take daily claritin or zyrtec. Rest. Drink plenty of fluids. AVOID trigger.  Follow up with your primary care physician this week as needed. Return to Urgent care for new or worsening concerns.

## 2017-03-29 NOTE — ED Triage Notes (Signed)
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 No previous breathing or cardiac history.

## 2017-04-17 LAB — HM PAP SMEAR

## 2017-04-21 ENCOUNTER — Other Ambulatory Visit: Payer: Self-pay | Admitting: Obstetrics & Gynecology

## 2017-04-21 DIAGNOSIS — N6320 Unspecified lump in the left breast, unspecified quadrant: Secondary | ICD-10-CM

## 2017-04-24 ENCOUNTER — Other Ambulatory Visit: Payer: Self-pay | Admitting: Obstetrics & Gynecology

## 2017-04-24 DIAGNOSIS — Z1231 Encounter for screening mammogram for malignant neoplasm of breast: Secondary | ICD-10-CM

## 2017-05-02 LAB — HM DEXA SCAN

## 2017-05-18 ENCOUNTER — Ambulatory Visit
Admission: RE | Admit: 2017-05-18 | Discharge: 2017-05-18 | Disposition: A | Discharge status: Home / Self Care | Payer: BC Managed Care – PPO | Source: Ambulatory Visit | Attending: Obstetrics & Gynecology | Admitting: Obstetrics & Gynecology

## 2017-05-18 DIAGNOSIS — Z1231 Encounter for screening mammogram for malignant neoplasm of breast: Secondary | ICD-10-CM

## 2017-07-10 ENCOUNTER — Other Ambulatory Visit: Payer: Self-pay | Admitting: Obstetrics & Gynecology

## 2017-07-10 DIAGNOSIS — Z803 Family history of malignant neoplasm of breast: Secondary | ICD-10-CM

## 2017-09-24 ENCOUNTER — Ambulatory Visit
Admission: RE | Admit: 2017-09-24 | Discharge: 2017-09-24 | Disposition: A | Discharge status: Home / Self Care | Payer: BC Managed Care – PPO | Source: Ambulatory Visit | Attending: Obstetrics & Gynecology | Admitting: Obstetrics & Gynecology

## 2017-09-24 DIAGNOSIS — Z803 Family history of malignant neoplasm of breast: Secondary | ICD-10-CM

## 2017-09-24 MED ORDER — GADOBENATE DIMEGLUMINE 529 MG/ML IV SOLN
20.0000 mL | Freq: Once | INTRAVENOUS | Status: AC | PRN
  Administered 2017-09-24: 20 mL via INTRAVENOUS

## 2018-04-12 ENCOUNTER — Other Ambulatory Visit: Payer: Self-pay | Admitting: Obstetrics & Gynecology

## 2018-04-12 DIAGNOSIS — Z1231 Encounter for screening mammogram for malignant neoplasm of breast: Secondary | ICD-10-CM

## 2018-05-21 ENCOUNTER — Ambulatory Visit
Admission: RE | Admit: 2018-05-21 | Discharge: 2018-05-21 | Disposition: A | Discharge status: Home / Self Care | Payer: BC Managed Care – PPO | Source: Ambulatory Visit | Attending: Obstetrics & Gynecology | Admitting: Obstetrics & Gynecology

## 2018-05-21 DIAGNOSIS — Z1231 Encounter for screening mammogram for malignant neoplasm of breast: Secondary | ICD-10-CM

## 2019-04-18 ENCOUNTER — Other Ambulatory Visit: Payer: Self-pay | Admitting: Physician Assistant

## 2019-04-18 DIAGNOSIS — M25551 Pain in right hip: Secondary | ICD-10-CM

## 2019-04-22 ENCOUNTER — Other Ambulatory Visit: Payer: Self-pay

## 2019-04-22 ENCOUNTER — Ambulatory Visit: Payer: BC Managed Care – PPO

## 2019-04-25 ENCOUNTER — Other Ambulatory Visit: Payer: Self-pay | Admitting: Physician Assistant

## 2019-04-25 DIAGNOSIS — M25551 Pain in right hip: Secondary | ICD-10-CM

## 2019-05-05 ENCOUNTER — Ambulatory Visit (INDEPENDENT_AMBULATORY_CARE_PROVIDER_SITE_OTHER): Payer: BC Managed Care – PPO

## 2019-05-05 ENCOUNTER — Other Ambulatory Visit: Payer: Self-pay | Admitting: Physician Assistant

## 2019-05-05 ENCOUNTER — Other Ambulatory Visit: Payer: Self-pay

## 2019-05-05 DIAGNOSIS — M25551 Pain in right hip: Secondary | ICD-10-CM

## 2019-05-05 MED ORDER — GADOBUTROL 1 MMOL/ML IV SOLN: 10.0000 mL | Freq: Once | INTRAVENOUS | Status: AC | PRN

## 2019-05-28 ENCOUNTER — Other Ambulatory Visit: Payer: Self-pay

## 2019-05-28 ENCOUNTER — Ambulatory Visit: Payer: BC Managed Care – PPO | Admitting: Family Medicine

## 2019-05-28 DIAGNOSIS — G5701 Lesion of sciatic nerve, right lower limb: Secondary | ICD-10-CM | POA: Diagnosis not present

## 2019-05-28 MED ORDER — GABAPENTIN 100 MG PO CAPS: 200.0000 mg | ORAL_CAPSULE | Freq: Every day | ORAL | 0 refills | Status: DC

## 2019-05-28 MED ORDER — VITAMIN D (ERGOCALCIFEROL) 1.25 MG (50000 UNIT) PO CAPS: 50000.0000 [IU] | ORAL_CAPSULE | ORAL | 0 refills | Status: DC

## 2019-05-28 NOTE — Progress Notes (Signed)
[name] [address] [place] [address] Phone: [phone] Subjective:   [name], am serving as a scribe for Dr. Antoine Primas. This visit occurred during the SARS-CoV-2 public health emergency.  Safety protocols were in place, including screening questions prior to the visit, additional usage of staff PPE, and extensive cleaning of exam room while observing appropriate contact time as indicated for disinfecting solutions.   I'm seeing this patient by the request  of:  Joycelyn Rua, MD  CC: Back pain and leg pain  TIW:[id number]  SHRITHA BRESEE is a 59 y.o. female coming in with complaint of lower back pain. Tore hamstring off the bone years ago. Was using celebrex for pain. Last summer patient started having back pain that progressively getting worse. Switched medication to meloxicam which seems to be helping decrease pain in back. Had MRI in January 2021 for lumbar spine and right hip.  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 No lumbar spinal stenosis or convincing right side neural impingement.  IMPRESSION: 1. Small ganglion cyst or fluid signal residua from prior shear injury along the superficial fascia margin of the iliotibial tract along its posterior margin near the lateral femoral intermuscular septum. 2. Mild proximal hamstring tendinopathy bilaterally. 3. Mild bilateral spurring and sclerosis in the pubic symphysis. 4. Scattered sigmoid colon diverticula.      Past Medical History:  Diagnosis Date  . GERD (gastroesophageal reflux disease)    Past Surgical History:  Procedure Laterality Date  . ABDOMINAL HYSTERECTOMY    . APPENDECTOMY    . BREAST EXCISIONAL BIOPSY    . CHOLECYSTECTOMY    . laproscopy     Social History   Socioeconomic History  . Marital status: Married    Spouse name: Not on file  . Number of children: Not on file  . Years  of education: Not on file  . Highest education level: Not on file  Occupational History  . Not on file  Tobacco Use  . Smoking status: Never Smoker  Substance and Sexual Activity  . Alcohol use: Yes    Comment: occasional  . Drug use: No  . Sexual activity: Not on file  Other Topics Concern  . Not on file  Social History Narrative  . Not on file   Social Determinants of Health   Financial Resource Strain:   . Difficulty of Paying Living Expenses: Not on file  Food Insecurity:   . Worried About Programme researcher, broadcasting/film/video in the Last Year: Not on file  . Ran Out of Food in the Last Year: Not on file  Transportation Needs:   . Lack of Transportation (Medical): Not on file  . Lack of Transportation (Non-Medical): Not on file  Physical Activity:   . Days of Exercise per Week: Not on file  . Minutes of Exercise per Session: Not on file  Stress:   . Feeling of Stress : Not on file  Social Connections:   . Frequency of Communication with Friends and Family: Not on file  . Frequency of Social Gatherings with Friends and Family: Not on file  . Attends Religious Services: Not on file  . Active Member of Clubs or Organizations: Not on file  . Attends Banker Meetings: Not on file  . Marital Status: Not on file   Allergies  Allergen Reactions  . Tape Other (See Comments)    Welts and pain   Family  History  Problem Relation Age of Onset  . Breast cancer Mother     Current Outpatient Medications (Endocrine & Metabolic):  .  estradiol (VIVELLE-DOT) 0.025 MG/24HR, Place 1 patch onto the skin 2 (two) times a week.     Current Outpatient Medications (Respiratory):  .  albuterol (PROVENTIL HFA;VENTOLIN HFA) 108 (90 Base) MCG/ACT inhaler, Inhale 2 puffs into the lungs every 4 (four) hours as needed for wheezing or shortness of breath. .  cetirizine (ZYRTEC) 10 MG tablet, Take 10 mg by mouth daily. Patient uses this medication for her allergies.       Current  Outpatient Medications (Other):  .  omeprazole (PRILOSEC) 20 MG capsule, Take 20 mg by mouth daily. Marland Kitchen  gabapentin (NEURONTIN) 100 MG capsule, Take 2 capsules (200 mg total) by mouth at bedtime. .  Vitamin D, Ergocalciferol, (DRISDOL) 1.25 MG (50000 UNIT) CAPS capsule, Take 1 capsule (50,000 Units total) by mouth every 7 (seven) days.   Facility-Administered Medications Ordered in Other Visits (Other):  .  gadobutrol (GADAVIST) 1 MMOL/ML injection 10 mL No current facility-administered medications for this visit.   Reviewed prior external information including notes and imaging from  primary care provider As well as notes that were available from care everywhere and other healthcare systems.  Past medical history, social, surgical and family history all reviewed in electronic medical record.  No pertanent information unless stated regarding to the chief complaint.   Review of Systems:  No headache, visual changes, nausea, vomiting, diarrhea, constipation, dizziness, abdominal pain, skin rash, fevers, chills, night sweats, weight loss, swollen lymph nodes, body aches, joint swelling, chest pain, shortness of breath, mood changes. POSITIVE muscle aches  Objective  Blood pressure 110/72, pulse (!) 108, height 5\' 9"  (1.753 m), weight 272 lb (123.4 kg), SpO2 98 %.   General: No apparent distress alert and oriented x3 mood and affect normal, dressed appropriately.  Overweight HEENT: Pupils equal, extraocular movements intact  Respiratory: Patient's speak in full sentences and does not appear short of breath  Cardiovascular: No lower extremity edema, non tender, no erythema  Skin: Warm dry intact with no signs of infection or rash on extremities or on axial skeleton.  Abdomen: Soft nontender  Neuro: Cranial nerves II through XII are intact, neurovascularly intact in all extremities with 2+ DTRs and 2+ pulses.  Lymph: No lymphadenopathy of posterior or anterior cervical chain or axillae  bilaterally.  Gait normal with good balance and coordination.  MSK:  Non tender with full range of motion and good stability and symmetric strength and tone of shoulders, elbows, wrist, hip, knee and ankles bilaterally.  Low back exam shows the patient has some very mild loss of lordosis.  Patient does does have a positive Faber on the right side.  Severe tenderness to palpation more in the piriformis muscle.  Mild tightness of hamstrings but no radicular symptoms at that time.  More radicular symptoms with .  Neurovascularly intact distally with deep tendon reflexes intact  Pearlean Brownie; 15 additional minutes spent for Therapeutic exercises as stated in above notes.  This included exercises focusing on stretching, strengthening, with significant focus on eccentric aspects.   Long term goals include an improvement in range of motion, strength, endurance as well as avoiding reinjury. Patient's frequency would include in 1-2 times a day, 3-5 times a week for a duration of 6-12 weeks. Low back exercises that included:  Pelvic tilt/bracing instruction to focus on control of the pelvic girdle and lower abdominal muscles  Glute  strengthening exercises, focusing on proper firing of the glutes without engaging the low back muscles Proper stretching techniques for maximum relief for the hamstrings, hip flexors, low back and some rotation where tolerated   Proper technique shown and discussed handout in great detail with ATC.  All questions were discussed and answered.     Impression and Recommendations:     This case required medical decision making of moderate complexity. The above documentation has been reviewed and is accurate and complete Lyndal Pulley, DO       Note: This dictation was prepared with Dragon dictation along with smaller phrase technology. Any transcriptional errors that result from this process are unintentional.

## 2019-05-28 NOTE — Patient Instructions (Signed)
Gabapentin 200mg  at night Vit D once weekly Turmeric 500mg  daily See me in 5-6 weeks

## 2019-05-29 ENCOUNTER — Encounter: Payer: Self-pay | Admitting: Family Medicine

## 2019-05-29 DIAGNOSIS — G5701 Lesion of sciatic nerve, right lower limb: Secondary | ICD-10-CM | POA: Insufficient documentation

## 2019-05-29 NOTE — Assessment & Plan Note (Signed)
Using Netter's Orthopaedic Anatomy, reviewed with the patient the structures involved and how they related to diagnosis. The patient indicated understanding.   The patient was given a handout about classic piriformis stretching including [employer], Modified [employer], my self-described "Sink Stretch," and other piriformis rehab.  We also reviewed hip flexor and abductor strengthening, ham stretching  Rec deep massage, explained self-massage with ball  Vitamin D given, discussed icing regimen and home exercise, which activities to doing which wants to avoid.  Follow-up again in 4 to 8 weeks RTC in 4 weeks

## 2019-06-10 ENCOUNTER — Other Ambulatory Visit: Payer: Self-pay | Admitting: Obstetrics & Gynecology

## 2019-06-10 DIAGNOSIS — Z1231 Encounter for screening mammogram for malignant neoplasm of breast: Secondary | ICD-10-CM

## 2019-07-02 ENCOUNTER — Ambulatory Visit: Payer: BC Managed Care – PPO | Admitting: Family Medicine

## 2019-07-02 ENCOUNTER — Other Ambulatory Visit: Payer: Self-pay

## 2019-07-02 ENCOUNTER — Encounter: Payer: Self-pay | Admitting: Family Medicine

## 2019-07-02 DIAGNOSIS — G5701 Lesion of sciatic nerve, right lower limb: Secondary | ICD-10-CM | POA: Diagnosis not present

## 2019-07-02 MED ORDER — MELOXICAM 15 MG PO TABS: 15.0000 mg | ORAL_TABLET | Freq: Every day | ORAL | 0 refills | Status: DC

## 2019-07-02 NOTE — Assessment & Plan Note (Signed)
Significant improvement at this time.  We will continue with conservative therapy, discussed medication management including the gabapentin.  Patient given refill of meloxicam.  Follow-up with me again in 2 months

## 2019-07-02 NOTE — Progress Notes (Signed)
[place] Phone: [phone] Subjective:   Joanne [name], am serving as a scribe for Dr. Hulan Saas. This visit occurred during the SARS-CoV-2 public health emergency.  Safety protocols were in place, including screening questions prior to the visit, additional usage of staff PPE, and extensive cleaning of exam room while observing appropriate contact time as indicated for disinfecting solutions.   I'm seeing this patient by the request  of:  Orpah Melter, MD  CC: Right hip pain follow-up  NID:[id number]   05/28/2019 Using Netter's Orthopaedic Anatomy, reviewed with the patient the structures involved and how they related to diagnosis. The patient indicated understanding.   The patient was given a handout about classic piriformis stretching including [employer], Modified [employer], my self-described "Sink Stretch," and other piriformis rehab.  We also reviewed hip flexor and abductor strengthening, ham stretching  Rec deep massage, explained self-massage with ball  Vitamin D given, discussed icing regimen and home exercise, which activities to doing which wants to avoid.  Follow-up again in 4 to 8 weeks RTC in 4 weeks   Update 3/242021 Joanne [name] is a 59 y.o. female coming in with complaint of right hip pain. Patient states that she has only had one bad day since last visit. Use meloxicam from another provider and would like 2 more months worth of medication so that she can make it to the summer without pain. Works as a Pharmacist, hospital. Has changed her shoes which help her back pain during the day. Is also performing HEP regularly.  Patient would state feel approximately 80% better     Past Medical History:  Diagnosis Date  . GERD (gastroesophageal reflux disease)    Past Surgical History:  Procedure Laterality Date  . ABDOMINAL HYSTERECTOMY    . APPENDECTOMY    . BREAST EXCISIONAL BIOPSY    .  CHOLECYSTECTOMY    . laproscopy     Social History   Socioeconomic History  . Marital status: Married    Spouse name: Not on file  . Number of children: Not on file  . Years of education: Not on file  . Highest education level: Not on file  Occupational History  . Not on file  Tobacco Use  . Smoking status: Never Smoker  Substance and Sexual Activity  . Alcohol use: Yes    Comment: occasional  . Drug use: [name]  . Sexual activity: Not on file  Other Topics Concern  . Not on file  Social History Narrative  . Not on file   Social Determinants of Health   Financial Resource Strain:   . Difficulty of Paying Living Expenses:   Food Insecurity:   . Worried About Charity fundraiser in the Last Year:   . Arboriculturist in the Last Year:   Transportation Needs:   . Film/video editor (Medical):   Marland Kitchen Lack of Transportation (Non-Medical):   Physical Activity:   . Days of Exercise per Week:   . Minutes of Exercise per Session:   Stress:   . Feeling of Stress :   Social Connections:   . Frequency of Communication with Friends and Family:   . Frequency of Social Gatherings with Friends and Family:   . Attends Religious Services:   . Active Member of Clubs or Organizations:   . Attends Archivist Meetings:   Marland Kitchen Marital Status:    Allergies  Allergen Reactions  .  Tape Other (See Comments)    Welts and pain   Family History  Problem Relation Age of Onset  . Breast cancer Mother     Current Outpatient Medications (Endocrine & Metabolic):  .  estradiol (VIVELLE-DOT) 0.025 MG/24HR, Place 1 patch onto the skin 2 (two) times a week.     Current Outpatient Medications (Respiratory):  .  albuterol (PROVENTIL HFA;VENTOLIN HFA) 108 (90 Base) MCG/ACT inhaler, Inhale 2 puffs into the lungs every 4 (four) hours as needed for wheezing or shortness of breath. .  cetirizine (ZYRTEC) 10 MG tablet, Take 10 mg by mouth daily. Patient uses this medication for her  allergies.   Current Outpatient Medications (Analgesics):  .  meloxicam (MOBIC) 15 MG tablet, Take 15 mg by mouth daily. .  meloxicam (MOBIC) 15 MG tablet, Take 1 tablet (15 mg total) by mouth daily.     Current Outpatient Medications (Other):  .  gabapentin (NEURONTIN) 100 MG capsule, Take 2 capsules (200 mg total) by mouth at bedtime. Marland Kitchen  omeprazole (PRILOSEC) 20 MG capsule, Take 20 mg by mouth daily. .  Vitamin D, Ergocalciferol, (DRISDOL) 1.25 MG (50000 UNIT) CAPS capsule, Take 1 capsule (50,000 Units total) by mouth every 7 (seven) days.   Facility-Administered Medications Ordered in Other Visits (Other):  .  gadobutrol (GADAVIST) 1 MMOL/ML injection 10 mL [name] current facility-administered medications for this visit.   Reviewed prior external information including notes and imaging from  primary care provider As well as notes that were available from care everywhere and other healthcare systems.  Past medical history, social, surgical and family history all reviewed in electronic medical record.  [name] pertanent information unless stated regarding to the chief complaint.   Review of Systems:  [name] headache, visual changes, nausea, vomiting, diarrhea, constipation, dizziness, abdominal pain, skin rash, fevers, chills, night sweats, weight loss, swollen lymph nodes, body aches, joint swelling, chest pain, shortness of breath, mood changes. POSITIVE muscle aches  Objective  Blood pressure 118/82, pulse 72, height 5\' 9"  (1.753 m), weight 267 lb (121.1 kg), SpO2 98 %.   General: [name] apparent distress alert and oriented x3 mood and affect normal, dressed appropriately.  HEENT: Pupils equal, extraocular movements intact  Respiratory: Patient's speak in full sentences and does not appear short of breath  Cardiovascular: [name] lower extremity edema, non tender, [name] erythema  Neuro: Cranial nerves II through XII are intact, neurovascularly intact in all extremities with 2+ DTRs and 2+ pulses.   Gait normal with good balance and coordination.  MSK:  Non tender with full range of motion and good stability and symmetric strength and tone of shoulders, elbows, wrist, knee and ankles bilaterally.  Right hip exam shows the patient does have some positive noted.  Some mild tenderness to palpation over the piriformis still noted.  Minimal over the greater trochanteric bursitis.  Negative straight leg test.  Minimal pain to [name] pain in the posterior aspect of the back.   Impression and Recommendations:     The above documentation has been reviewed and is accurate and complete Pearlean Brownie, DO       Note: This dictation was prepared with Dragon dictation along with smaller phrase technology. Any transcriptional errors that result from this process are unintentional.

## 2019-07-02 NOTE — Patient Instructions (Signed)
Gabapentin as needed Can take meloxicam Continue what you are doing See me again in 2 months

## 2019-08-28 ENCOUNTER — Other Ambulatory Visit: Payer: Self-pay

## 2019-08-28 ENCOUNTER — Telehealth: Payer: Self-pay | Admitting: Family Medicine

## 2019-08-28 MED ORDER — VITAMIN D (ERGOCALCIFEROL) 1.25 MG (50000 UNIT) PO CAPS: 50000.0000 [IU] | ORAL_CAPSULE | ORAL | 0 refills | Status: DC

## 2019-08-28 NOTE — Telephone Encounter (Signed)
Pt called, cancelled her follow up as she is doing really well. She would like to continue the Vit D you prescribed, if you think it is appropriate. [employer] in [place] Pt phone [phone]

## 2019-08-28 NOTE — Telephone Encounter (Signed)
Left message for patient regarding rx. 

## 2019-09-02 ENCOUNTER — Ambulatory Visit: Payer: BC Managed Care – PPO | Admitting: Family Medicine

## 2019-09-10 ENCOUNTER — Ambulatory Visit
Admission: RE | Admit: 2019-09-10 | Discharge: 2019-09-10 | Disposition: A | Discharge status: Home / Self Care | Payer: BC Managed Care – PPO | Source: Ambulatory Visit | Attending: Obstetrics & Gynecology | Admitting: Obstetrics & Gynecology

## 2019-09-10 ENCOUNTER — Other Ambulatory Visit: Payer: Self-pay

## 2019-09-10 DIAGNOSIS — Z1231 Encounter for screening mammogram for malignant neoplasm of breast: Secondary | ICD-10-CM

## 2019-09-10 LAB — HM MAMMOGRAPHY

## 2019-10-20 ENCOUNTER — Ambulatory Visit: Payer: BC Managed Care – PPO | Admitting: Family Medicine

## 2019-10-28 ENCOUNTER — Other Ambulatory Visit: Payer: Self-pay

## 2019-10-28 ENCOUNTER — Encounter: Payer: Self-pay | Admitting: Family Medicine

## 2019-10-28 ENCOUNTER — Ambulatory Visit (INDEPENDENT_AMBULATORY_CARE_PROVIDER_SITE_OTHER): Payer: BC Managed Care – PPO | Admitting: Family Medicine

## 2019-10-28 VITALS — BP 128/77 | HR 90 | Temp 98.4°F | Ht 69.0 in | Wt 268.0 lb

## 2019-10-28 DIAGNOSIS — Z7689 Persons encountering health services in other specified circumstances: Secondary | ICD-10-CM | POA: Diagnosis not present

## 2019-10-28 DIAGNOSIS — E669 Obesity, unspecified: Secondary | ICD-10-CM | POA: Insufficient documentation

## 2019-10-28 DIAGNOSIS — K219 Gastro-esophageal reflux disease without esophagitis: Secondary | ICD-10-CM | POA: Diagnosis not present

## 2019-10-28 MED ORDER — OMEPRAZOLE 20 MG PO CPDR: 20.0000 mg | DELAYED_RELEASE_CAPSULE | Freq: Two times a day (BID) | ORAL | 1 refills | Status: DC

## 2019-10-28 NOTE — Progress Notes (Signed)
Patient ID: Joanne [name], female  DOB: [birth year], 59 y.o.   MRN: [id number] Patient Care Team    Relationship Specialty Notifications Start End  Ma Hillock, DO PCP - General Family Medicine  10/28/19   Lettie Hedges, DO Consulting Physician Obstetrics and Gynecology  10/28/19   Arta Silence, MD Consulting Physician Gastroenterology  10/28/19   Lyndal Pulley, Scott Physician Sports Medicine  10/28/19   Orbie Hurst, MD Referring Physician Dermatology  10/28/19     Chief Complaint  Patient presents with  . Establish Care    Subjective: Joanne [name] is a 59 y.o.  female present for new patient establishment. All past medical history, surgical history, allergies, family history, immunizations, medications and social history were updated in the electronic medical record today. All recent labs, ED visits and hospitalizations within the last year were reviewed.  Gastroesophageal reflux disease without esophagitis/LPR Pt reports she was started on omeprazole 20 mg BID about 3-4 yrs ago for hoarseness. It has worked well for her.  She continues to take omeprazole once daily and sometimes twice daily if needed.  Depression screen [facility] 2/9 10/28/2019  Decreased Interest 0  Down, Depressed, Hopeless 0  PHQ - 2 Score 0  Altered sleeping 0  Tired, decreased energy 0  Change in appetite 2  Feeling bad or failure about yourself  0  Trouble concentrating 0  Moving slowly or fidgety/restless 0  Suicidal thoughts 0  PHQ-9 Score 2  Difficult doing work/chores Not difficult at all   No flowsheet data found.      Fall Risk  10/28/2019  Falls in the past year? 0  Number falls in past yr: 0  Injury with Fall? 0   Immunization History  Administered Date(s) Administered  . MMR 08/08/2001  . Tdap 08/07/2011    No exam data present  Past Medical History:  Diagnosis Date  . Arthritis   . Diverticulitis   . Endometriosis    Abdominal hysterectomy  . GERD  (gastroesophageal reflux disease) 06/2009   EGD with small gastric and duodenal erosions/inflammation.  . Hamstring injury 2014   Right hamstring tear  . Hay fever   . Migraines    Allergies  Allergen Reactions  . Tape Other (See Comments)    Welts and pain   Past Surgical History:  Procedure Laterality Date  . ABDOMINAL HYSTERECTOMY  1998   Endometriosis  . APPENDECTOMY  1986  . BREAST EXCISIONAL BIOPSY Right 2005  . CHOLECYSTECTOMY  2010  . ESOPHAGOGASTRODUODENOSCOPY  06/2009   EGD with small gastric and duodenal erosions/inflammation.  . EXPLORATORY LAPAROTOMY  1988   x3 D/T endometriosis  . OVARIAN CYST REMOVAL  08/2007   Family History  Problem Relation Age of Onset  . Breast cancer Mother   . Arthritis Mother   . Hypertension Mother   . Miscarriages / [place] Mother   . Diabetes Mother   . Arthritis Father   . Heart attack Father   . Diabetes Father   . Heart disease Father   . Early death Father    Social History   Social History Narrative   Marital status/children/pets: Married.  G2 P2   Education/employment: College degree, employed as a Adult nurse:      -Wears a bicycle helmet riding a bike: Yes     -smoke alarm in the home:Yes     - wears seatbelt: Yes     - Feels safe in their  relationships: Yes    Allergies as of 10/28/2019      Reactions   Tape Other (See Comments)   Welts and pain      Medication List       Accurate as of October 28, 2019  5:33 PM. If you have any questions, ask your nurse or doctor.        STOP taking these medications   estradiol 0.025 MG/24HR Commonly known as: VIVELLE-DOT Stopped by: Howard Pouch, DO   gabapentin 100 MG capsule Commonly known as: NEURONTIN Stopped by: Howard Pouch, DO   meloxicam 15 MG tablet Commonly known as: MOBIC Stopped by: Howard Pouch, DO     TAKE these medications   albuterol 108 (90 Base) MCG/ACT inhaler Commonly known as: VENTOLIN HFA Inhale 2 puffs into the lungs  every 4 (four) hours as needed for wheezing or shortness of breath.   cetirizine 10 MG tablet Commonly known as: ZYRTEC Take 10 mg by mouth daily. Patient uses this medication for her allergies.   omeprazole 20 MG capsule Commonly known as: PRILOSEC Take 1 capsule (20 mg total) by mouth 2 (two) times daily before a meal. What changed: when to take this Changed by: Howard Pouch, DO   spironolactone 25 MG tablet Commonly known as: ALDACTONE Take 25 mg by mouth in the morning and at bedtime.   Vitamin D (Ergocalciferol) 1.25 MG (50000 UNIT) Caps capsule Commonly known as: DRISDOL Take 1 capsule (50,000 Units total) by mouth every 7 (seven) days.       All past medical history, surgical history, allergies, family history, immunizations andmedications were updated in the EMR today and reviewed under the history and medication portions of their EMR.    No results found for this or any previous visit (from the past 2160 hour(s)).   ROS: 14 pt review of systems performed and negative (unless mentioned in an HPI)  Objective: BP 128/77   Pulse 90   Temp 98.4 F (36.9 C) (Temporal)   Ht 5' 9" (1.753 m)   Wt 268 lb (121.6 kg)   SpO2 97%   BMI 39.58 kg/m  Gen: Afebrile. No acute distress. Nontoxic in appearance, well-developed, well-nourished,  Pleasant female.  HENT: AT. [place].  no Cough on exam, no hoarseness on exam. Eyes:Pupils Equal Round Reactive to light, Extraocular movements intact,  Conjunctiva without redness, discharge or icterus. Neck/lymp/endocrine: Supple,no lymphadenopathy, no thyromegaly CV: RRR no murmur, no edema Chest: CTAB, no wheeze, rhonchi or crackles. Skin:Warm and well-perfused. Skin intact. Neuro/Msk:  Normal gait. PERLA. EOMi. Alert. Oriented x3.  Psych: Normal affect, dress and demeanor. Normal speech. Normal thought content and judgment.  Assessment/plan: Joanne [name] is a 59 y.o. female present for est care Gastroesophageal reflux disease without  esophagitis Stable.  Continue omep BID B12, vit d and mag levels q 3 yrs> will collect with upcoming CPE/labs.   Return in about 3 weeks (around 11/18/2019) for CPE (30 min) with fasting labs.  No orders of the defined types were placed in this encounter.  Meds ordered this encounter  Medications  . omeprazole (PRILOSEC) 20 MG capsule    Sig: Take 1 capsule (20 mg total) by mouth 2 (two) times daily before a meal.    Dispense:  180 capsule    Refill:  1    DC all other scripts for this medication please. Hold until requested.   Referral Orders  No referral(s) requested today     Note is dictated utilizing voice recognition  software. Although note has been proof read prior to signing, occasional typographical errors still can be missed. If any questions arise, please do not hesitate to call for verification.  Electronically signed by: Howard Pouch, DO [place]

## 2019-10-28 NOTE — Patient Instructions (Signed)
It was a pleasure to meet you today.  I have refilled your meds for you.  Please schedule your physical in 3 weeks and we will complete your preventive exam and fasting labs.   Please help [place] help you:  We are honored you have chosen [facility] for your Primary Care home. Below you will find basic instructions that you may need to access in the future. Please help [place] help you by reading the instructions, which cover many of the frequent questions we experience.   Prescription refills and request:  -In order to allow more efficient response time, please call your pharmacy for all refills. They will forward the request electronically to [place]. This allows for the quickest possible response. Request left on a nurse line can take longer to refill, since these are checked as time allows between office patients and other phone calls.  - refill request can take up to 3-5 working days to complete.  - If request is sent electronically and request is appropiate, it is usually completed in 1-2 business days.  - all patients will need to be seen routinely for all chronic medical conditions requiring prescription medications (see follow-up below). If you are overdue for follow up on your condition, you will be asked to make an appointment and we will call in enough medication to cover you until your appointment (up to 30 days).  - all controlled substances will require a face to face visit to request/refill.  - if you desire your prescriptions to go through a new pharmacy, and have an active script at original pharmacy, you will need to call your pharmacy and have scripts transferred to new pharmacy. This is completed between the pharmacy locations and not by your provider.    Results: Our office handles many outgoing and incoming calls daily. If we have not contacted you within 1 week about your results, please check your mychart to see if there is a message first and if not, then contact our office.  In  helping with this matter, you help decrease call volume, and therefore allow [place] to be able to respond to patients needs more efficiently.  We will always attempt to call you with results,  normal or abnormal. However, if we are unable to reach you we will send a message in your my chart with results.   Acute office visits (sick visit):  An acute visit is intended for a new problem and are scheduled in shorter time slots to allow schedule openings for patients with new problems. This is the appropriate visit to discuss a new problem. Problems will not be addressed by phone call or Echart message. Appointment is needed if requesting treatment. In order to provide you with excellent quality medical care with proper time for you to explain your problem, have an exam and receive treatment with instructions, these appointments should be limited to one new problem per visit. If you experience a new problem, in which you desire to be addressed, please make an acute office visit, we save openings on the schedule to accommodate you. Please do not save your new problem for any other type of visit, let us take care of it properly and quickly for you.   Follow up visits:  Depending on your condition(s) your provider will need to see you routinely in order to provide you with quality care and prescribe medication(s). Most chronic conditions (Example: hypertension, Diabetes, depression/anxiety... etc), require visits a couple times a year. Your provider will  instruct you on proper follow up for your personal medical conditions and history. Please make certain to make follow up appointments for your condition as instructed. Failing to do so could result in lapse in your medication treatment/refills. If you request a refill, and are overdue to be seen on a condition, we will always provide you with a 30 day script (once) to allow you time to schedule.    Medicare wellness (well visit): - we have a wonderful Nurse Selena Batten),  that will meet with you and provide you will yearly medicare wellness visits. These visits should occur yearly (can not be scheduled less than 1 calendar year apart) and cover preventive health, immunizations, advance directives and screenings you are entitled to yearly through your medicare benefits. Do not miss out on your entitled benefits, this is when medicare will pay for these benefits to be ordered for you.  These are strongly encouraged by your provider and is the appropriate type of visit to make certain you are up to date with all preventive health benefits. If you have not had your medicare wellness exam in the last 12 months, please make certain to schedule one by calling the office and schedule your medicare wellness with Selena Batten as soon as possible.   Yearly physical (well visit):  - Adults are recommended to be seen yearly for physicals. Check with your insurance and date of your last physical, most insurances require one calendar year between physicals. Physicals include all preventive health topics, screenings, medical exam and labs that are appropriate for gender/age and history. You may have fasting labs needed at this visit. This is a well visit (not a sick visit), new problems should not be covered during this visit (see acute visit).  - Pediatric patients are seen more frequently when they are younger. Your provider will advise you on well child visit timing that is appropriate for your their age. - This is not a medicare wellness visit. Medicare wellness exams do not have an exam portion to the visit. Some medicare companies allow for a physical, some do not allow a yearly physical. If your medicare allows a yearly physical you can schedule the medicare wellness with our nurse Selena Batten and have your physical with your provider after, on the same day. Please check with insurance for your full benefits.   Late Policy/No Shows:  - all new patients should arrive 15-30 minutes earlier than  appointment to allow [place] time  to  obtain all personal demographics,  insurance information and for you to complete office paperwork. - All established patients should arrive 10-15 minutes earlier than appointment time to update all information and be checked in .  - In our best efforts to run on time, if you are late for your appointment you will be asked to either reschedule or if able, we will work you back into the schedule. There will be a wait time to work you back in the schedule,  depending on availability.  - If you are unable to make it to your appointment as scheduled, please call 24 hours ahead of time to allow [place] to fill the time slot with someone else who needs to be seen. If you do not cancel your appointment ahead of time, you may be charged a no show fee.

## 2019-11-05 ENCOUNTER — Encounter: Payer: Self-pay | Admitting: Family Medicine

## 2019-11-13 ENCOUNTER — Other Ambulatory Visit: Payer: Self-pay

## 2019-11-13 ENCOUNTER — Encounter: Payer: Self-pay | Admitting: Family Medicine

## 2019-11-13 ENCOUNTER — Ambulatory Visit (INDEPENDENT_AMBULATORY_CARE_PROVIDER_SITE_OTHER): Payer: BC Managed Care – PPO | Admitting: Family Medicine

## 2019-11-13 VITALS — BP 119/78 | HR 69 | Temp 98.2°F | Ht 69.0 in | Wt 270.6 lb

## 2019-11-13 DIAGNOSIS — M2550 Pain in unspecified joint: Secondary | ICD-10-CM | POA: Insufficient documentation

## 2019-11-13 DIAGNOSIS — Z5181 Encounter for therapeutic drug level monitoring: Secondary | ICD-10-CM | POA: Diagnosis not present

## 2019-11-13 DIAGNOSIS — K219 Gastro-esophageal reflux disease without esophagitis: Secondary | ICD-10-CM

## 2019-11-13 DIAGNOSIS — Z0000 Encounter for general adult medical examination without abnormal findings: Secondary | ICD-10-CM

## 2019-11-13 DIAGNOSIS — Z23 Encounter for immunization: Secondary | ICD-10-CM | POA: Diagnosis not present

## 2019-11-13 DIAGNOSIS — Z1159 Encounter for screening for other viral diseases: Secondary | ICD-10-CM

## 2019-11-13 DIAGNOSIS — E669 Obesity, unspecified: Secondary | ICD-10-CM

## 2019-11-13 DIAGNOSIS — Z114 Encounter for screening for human immunodeficiency virus [HIV]: Secondary | ICD-10-CM

## 2019-11-13 DIAGNOSIS — Z131 Encounter for screening for diabetes mellitus: Secondary | ICD-10-CM | POA: Diagnosis not present

## 2019-11-13 DIAGNOSIS — Z130 Encounter for screening for diseases of the blood and blood-forming organs and certain disorders involving the immune mechanism: Secondary | ICD-10-CM | POA: Diagnosis not present

## 2019-11-13 DIAGNOSIS — Z79899 Other long term (current) drug therapy: Secondary | ICD-10-CM

## 2019-11-13 LAB — LIPID PANEL
Cholesterol: 204 mg/dL — ABNORMAL HIGH (ref 0–200)
HDL: 48.5 mg/dL (ref >39.00)
LDL Cholesterol: 141 mg/dL — ABNORMAL HIGH (ref 0–99)
NonHDL: 155.81
Total CHOL/HDL Ratio: 4
Triglycerides: 74 mg/dL (ref 0.0–149.0)
VLDL: 14.8 mg/dL (ref 0.0–40.0)

## 2019-11-13 LAB — COMPREHENSIVE METABOLIC PANEL
ALT: 29 U/L (ref 0–35)
AST: 23 U/L (ref 0–37)
Albumin: 4.3 g/dL (ref 3.5–5.2)
Alkaline Phosphatase: 71 U/L (ref 39–117)
BUN: 13 mg/dL (ref 6–23)
CO2: 28 mEq/L (ref 19–32)
Calcium: 9.5 mg/dL (ref 8.4–10.5)
Chloride: 105 mEq/L (ref 96–112)
Creatinine, Ser: 0.82 mg/dL (ref 0.40–1.20)
GFR: 71.47 mL/min (ref >60.00)
Glucose, Bld: 83 mg/dL (ref 70–99)
Potassium: 4.3 mEq/L (ref 3.5–5.1)
Sodium: 139 mEq/L (ref 135–145)
Total Bilirubin: 0.6 mg/dL (ref 0.2–1.2)
Total Protein: 6.9 g/dL (ref 6.0–8.3)

## 2019-11-13 LAB — CBC
HCT: 38.6 % (ref 36.0–46.0)
Hemoglobin: 12.9 g/dL (ref 12.0–15.0)
MCHC: 33.3 g/dL (ref 30.0–36.0)
MCV: 91.7 fl (ref 78.0–100.0)
Platelets: 217 10*3/uL (ref 150.0–400.0)
RBC: 4.21 Mil/uL (ref 3.87–5.11)
RDW: 13 % (ref 11.5–15.5)
WBC: 4.1 10*3/uL (ref 4.0–10.5)

## 2019-11-13 LAB — MAGNESIUM: Magnesium: 2.1 mg/dL (ref 1.5–2.5)

## 2019-11-13 LAB — VITAMIN D 25 HYDROXY (VIT D DEFICIENCY, FRACTURES): VITD: 36.79 ng/mL (ref 30.00–100.00)

## 2019-11-13 LAB — VITAMIN B12: Vitamin B-12: 282 pg/mL (ref 211–911)

## 2019-11-13 LAB — HEMOGLOBIN A1C: Hgb A1c MFr Bld: 5.3 % (ref 4.6–6.5)

## 2019-11-13 LAB — TSH: TSH: 2.39 u[IU]/mL (ref 0.35–4.50)

## 2019-11-13 MED ORDER — OMEPRAZOLE 20 MG PO CPDR: 20.0000 mg | DELAYED_RELEASE_CAPSULE | Freq: Two times a day (BID) | ORAL | 3 refills | Status: DC

## 2019-11-13 MED ORDER — MELOXICAM 15 MG PO TABS: 15.0000 mg | ORAL_TABLET | Freq: Every day | ORAL | 3 refills | Status: DC

## 2019-11-13 NOTE — Patient Instructions (Signed)
Health Maintenance, Female Adopting a healthy lifestyle and getting preventive care are important in promoting health and wellness. Ask your health care provider about:  The right schedule for you to have regular tests and exams.  Things you can do on your own to prevent diseases and keep yourself healthy. What should I know about diet, weight, and exercise? Eat a healthy diet   Eat a diet that includes plenty of vegetables, fruits, low-fat dairy products, and lean protein.  Do not eat a lot of foods that are high in solid fats, added sugars, or sodium. Maintain a healthy weight Body mass index (BMI) is used to identify weight problems. It estimates body fat based on height and weight. Your health care provider can help determine your BMI and help you achieve or maintain a healthy weight. Get regular exercise Get regular exercise. This is one of the most important things you can do for your health. Most adults should:  Exercise for at least 150 minutes each week. The exercise should increase your heart rate and make you sweat (moderate-intensity exercise).  Do strengthening exercises at least twice a week. This is in addition to the moderate-intensity exercise.  Spend less time sitting. Even light physical activity can be beneficial. Watch cholesterol and blood lipids Have your blood tested for lipids and cholesterol at 58 years of age, then have this test every 5 years. Have your cholesterol levels checked more often if:  Your lipid or cholesterol levels are high.  You are older than 58 years of age.  You are at high risk for heart disease. What should I know about cancer screening? Depending on your health history and family history, you may need to have cancer screening at various ages. This may include screening for:  Breast cancer.  Cervical cancer.  Colorectal cancer.  Skin cancer.  Lung cancer. What should I know about heart disease, diabetes, and high blood  pressure? Blood pressure and heart disease  High blood pressure causes heart disease and increases the risk of stroke. This is more likely to develop in people who have high blood pressure readings, are of African descent, or are overweight.  Have your blood pressure checked: ? Every 3-5 years if you are 18-39 years of age. ? Every year if you are 40 years old or older. Diabetes Have regular diabetes screenings. This checks your fasting blood sugar level. Have the screening done:  Once every three years after age 40 if you are at a normal weight and have a low risk for diabetes.  More often and at a younger age if you are overweight or have a high risk for diabetes. What should I know about preventing infection? Hepatitis B If you have a higher risk for hepatitis B, you should be screened for this virus. Talk with your health care provider to find out if you are at risk for hepatitis B infection. Hepatitis C Testing is recommended for:  Everyone born from 1945 through 1965.  Anyone with known risk factors for hepatitis C. Sexually transmitted infections (STIs)  Get screened for STIs, including gonorrhea and chlamydia, if: ? You are sexually active and are younger than 58 years of age. ? You are older than 58 years of age and your health care provider tells you that you are at risk for this type of infection. ? Your sexual activity has changed since you were last screened, and you are at increased risk for chlamydia or gonorrhea. Ask your health care provider if   you are at risk.  Ask your health care provider about whether you are at high risk for HIV. Your health care provider may recommend a prescription medicine to help prevent HIV infection. If you choose to take medicine to prevent HIV, you should first get tested for HIV. You should then be tested every 3 months for as long as you are taking the medicine. Pregnancy  If you are about to stop having your period (premenopausal) and  you may become pregnant, seek counseling before you get pregnant.  Take 400 to 800 micrograms (mcg) of folic acid every day if you become pregnant.  Ask for birth control (contraception) if you want to prevent pregnancy. Osteoporosis and menopause Osteoporosis is a disease in which the bones lose minerals and strength with aging. This can result in bone fractures. If you are 65 years old or older, or if you are at risk for osteoporosis and fractures, ask your health care provider if you should:  Be screened for bone loss.  Take a calcium or vitamin D supplement to lower your risk of fractures.  Be given hormone replacement therapy (HRT) to treat symptoms of menopause. Follow these instructions at home: Lifestyle  Do not use any products that contain nicotine or tobacco, such as cigarettes, e-cigarettes, and chewing tobacco. If you need help quitting, ask your health care provider.  Do not use street drugs.  Do not share needles.  Ask your health care provider for help if you need support or information about quitting drugs. Alcohol use  Do not drink alcohol if: ? Your health care provider tells you not to drink. ? You are pregnant, may be pregnant, or are planning to become pregnant.  If you drink alcohol: ? Limit how much you use to 0-1 drink a day. ? Limit intake if you are breastfeeding.  Be aware of how much alcohol is in your drink. In the [place] one drink equals one 12 oz bottle of beer (355 mL), one 5 oz glass of wine (148 mL), or one 1 oz glass of hard liquor (44 mL). General instructions  Schedule regular health, dental, and eye exams.  Stay current with your vaccines.  Tell your health care provider if: ? You often feel depressed. ? You have ever been abused or do not feel safe at home. Summary  Adopting a healthy lifestyle and getting preventive care are important in promoting health and wellness.  Follow your health care provider's instructions about healthy  diet, exercising, and getting tested or screened for diseases.  Follow your health care provider's instructions on monitoring your cholesterol and blood pressure. This information is not intended to replace advice given to you by your health care provider. Make sure you discuss any questions you have with your health care provider. Document Revised: 03/20/2018 Document Reviewed: 03/20/2018 [employer] Patient Education  2020 [employer]  

## 2019-11-13 NOTE — Progress Notes (Signed)
This visit occurred during the SARS-CoV-2 public health emergency.  Safety protocols were in place, including screening questions prior to the visit, additional usage of staff PPE, and extensive cleaning of exam room while observing appropriate contact time as indicated for disinfecting solutions.    Patient ID: Joanne [name], female  DOB: 1961/03/29, 59 y.o.   MRN: [id number] Patient Care Team    Relationship Specialty Notifications Start End  Ma Hillock, DO PCP - General Family Medicine  10/28/19   Keianna Hedges, DO Consulting Physician Obstetrics and Gynecology  10/28/19   Arta Silence, MD Consulting Physician Gastroenterology  10/28/19   Lyndal Pulley, [place] Physician Sports Medicine  10/28/19   Orbie Hurst, MD Referring Physician Dermatology  10/28/19     Chief Complaint  Patient presents with  . Annual Exam  . Gastroesophageal Reflux    Subjective:  Joanne [name] is a 59 y.o.  Female  present for CPE. All past medical history, surgical history, allergies, family history, immunizations, medications and social history were updated in the electronic medical record today. All recent labs, ED visits and hospitalizations within the last year were reviewed.  Health maintenance:  Colonoscopy: completed 2014,  follow up 2024.Dr. Paulita Fujita Mammogram: completed: 09/10/2019. Dr. Lynnette Caffey Cervical cancer screening: N/A Immunizations: tdap UTD 2013, Influenza - allergic, shingrix #1 today (nurse visit 2-6 mos).  Covid series completed Infectious disease screening: HIV and  Hep C screening desired.  DEXA: Completed at Dr. Lynnette Caffey- reported as normal Assistive device: None Oxygen use: None Patient has a Dental home. Hospitalizations/ED visits: Reviewed  Low back pain: Patient reports she had been on Mobic in the past.  She has not used the medication over the summer and was trying to come off the medication permanently but found that she is having increasing low back pain and  would like to restart medication.  GERD/long-term PPI use: Patient has been on omeprazole twice daily for many years.  She has been unable to taper off medication secondary to reoccurring symptoms. Depression screen [facility] 2/9 10/28/2019  Decreased Interest 0  Down, Depressed, Hopeless 0  PHQ - 2 Score 0  Altered sleeping 0  Tired, decreased energy 0  Change in appetite 2  Feeling bad or failure about yourself  0  Trouble concentrating 0  Moving slowly or fidgety/restless 0  Suicidal thoughts 0  PHQ-9 Score 2  Difficult doing work/chores Not difficult at all   No flowsheet data found.  Immunization History  Administered Date(s) Administered  . MMR 08/08/2001  . PFIZER SARS-COV-2 Vaccination 06/11/2019, 07/09/2019  . Tdap 08/07/2011  . Zoster Recombinat (Shingrix) 11/13/2019   Past Medical History:  Diagnosis Date  . Arthritis   . Diverticulitis   . Endometriosis    Abdominal hysterectomy  . GERD (gastroesophageal reflux disease) 06/2009   EGD with small gastric and duodenal erosions/inflammation.  . Hamstring injury 2014   Right hamstring tear  . Hay fever   . Migraines    Allergies  Allergen Reactions  . Tape Other (See Comments)    Welts and pain   Past Surgical History:  Procedure Laterality Date  . ABDOMINAL HYSTERECTOMY  1998   Endometriosis  . APPENDECTOMY  1986  . BREAST EXCISIONAL BIOPSY Right 2005  . CHOLECYSTECTOMY  2010  . ESOPHAGOGASTRODUODENOSCOPY  06/2009   EGD with small gastric and duodenal erosions/inflammation.  . EXPLORATORY LAPAROTOMY  1988   x3 D/T endometriosis  . OVARIAN CYST REMOVAL  08/2007   Family History  Problem Relation Age of Onset  . Breast cancer Mother   . Arthritis Mother   . Hypertension Mother   . Miscarriages / [place] Mother   . Diabetes Mother   . Arthritis Father   . Heart attack Father   . Diabetes Father   . Heart disease Father   . Early death Father    Social History   Social History Narrative    Marital status/children/pets: Married.  G2 P2   Education/employment: College degree, employed as a Adult nurse:      -Wears a bicycle helmet riding a bike: Yes     -smoke alarm in the home:Yes     - wears seatbelt: Yes     - Feels safe in their relationships: Yes    Allergies as of 11/13/2019      Reactions   Tape Other (See Comments)   Welts and pain      Medication List       Accurate as of November 13, 2019  5:09 PM. If you have any questions, ask your nurse or doctor.        STOP taking these medications   Vitamin D (Ergocalciferol) 1.25 MG (50000 UNIT) Caps capsule Commonly known as: DRISDOL Stopped by: Howard Pouch, DO     TAKE these medications   albuterol 108 (90 Base) MCG/ACT inhaler Commonly known as: VENTOLIN HFA Inhale 2 puffs into the lungs every 4 (four) hours as needed for wheezing or shortness of breath.   cetirizine 10 MG tablet Commonly known as: ZYRTEC Take 10 mg by mouth daily. Patient uses this medication for her allergies.   meloxicam 15 MG tablet Commonly known as: MOBIC Take 1 tablet (15 mg total) by mouth daily. Started by: Howard Pouch, DO   omeprazole 20 MG capsule Commonly known as: PRILOSEC Take 1 capsule (20 mg total) by mouth 2 (two) times daily before a meal.   spironolactone 25 MG tablet Commonly known as: ALDACTONE Take 25 mg by mouth in the morning and at bedtime.       All past medical history, surgical history, allergies, family history, immunizations andmedications were updated in the EMR today and reviewed under the history and medication portions of their EMR.     Recent Results (from the past 2160 hour(s))  HM MAMMOGRAPHY     Status: None   Collection Time: 09/10/19 12:00 AM  Result Value Ref Range   HM Mammogram 0-4 Bi-Rad 0-4 Bi-Rad, Self Reported Normal  B12     Status: None   Collection Time: 11/13/19 11:35 AM  Result Value Ref Range   Vitamin B-12 282 211 - 911 pg/mL  Vitamin D (25 hydroxy)     Status:  None   Collection Time: 11/13/19 11:35 AM  Result Value Ref Range   VITD 36.79 30.00 - 100.00 ng/mL  Magnesium     Status: None   Collection Time: 11/13/19 11:35 AM  Result Value Ref Range   Magnesium 2.1 1.5 - 2.5 mg/dL  CBC     Status: None   Collection Time: 11/13/19 11:35 AM  Result Value Ref Range   WBC 4.1 4.0 - 10.5 K/uL   RBC 4.21 3.87 - 5.11 Mil/uL   Platelets 217.0 150 - 400 K/uL   Hemoglobin 12.9 12.0 - 15.0 g/dL   HCT 38.6 36 - 46 %   MCV 91.7 78.0 - 100.0 fl   MCHC 33.3 30.0 - 36.0 g/dL   RDW 13.0 11.5 - 15.5 %  Comp Met (CMET)     Status: None   Collection Time: 11/13/19 11:35 AM  Result Value Ref Range   Sodium 139 135 - 145 mEq/L   Potassium 4.3 3.5 - 5.1 mEq/L   Chloride 105 96 - 112 mEq/L   CO2 28 19 - 32 mEq/L   Glucose, Bld 83 70 - 99 mg/dL   BUN 13 6 - 23 mg/dL   Creatinine, Ser 0.82 0.40 - 1.20 mg/dL   Total Bilirubin 0.6 0.2 - 1.2 mg/dL   Alkaline Phosphatase 71 39 - 117 U/L   AST 23 0 - 37 U/L   ALT 29 0 - 35 U/L   Total Protein 6.9 6.0 - 8.3 g/dL   Albumin 4.3 3.5 - 5.2 g/dL   GFR 71.47 >60.00 mL/min   Calcium 9.5 8.4 - 10.5 mg/dL  TSH     Status: None   Collection Time: 11/13/19 11:35 AM  Result Value Ref Range   TSH 2.39 0.35 - 4.50 uIU/mL  Lipid panel     Status: Abnormal   Collection Time: 11/13/19 11:35 AM  Result Value Ref Range   Cholesterol 204 (H) 0 - 200 mg/dL    Comment: ATP III Classification       Desirable:  < 200 mg/dL               Borderline High:  200 - 239 mg/dL          High:  > = 240 mg/dL   Triglycerides 74.0 0 - 149 mg/dL    Comment: Normal:  <150 mg/dLBorderline High:  150 - 199 mg/dL   HDL 48.50 >39.00 mg/dL   VLDL 14.8 0.0 - 40.0 mg/dL   LDL Cholesterol 141 (H) 0 - 99 mg/dL   Total CHOL/HDL Ratio 4     Comment:                Men          Women1/2 Average Risk     3.4          3.3Average Risk          5.0          4.42X Average Risk          9.6          7.13X Average Risk          15.0          11.0                        NonHDL 155.81     Comment: NOTE:  Non-HDL goal should be 30 mg/dL higher than patient's LDL goal (i.e. LDL goal of < 70 mg/dL, would have non-HDL goal of < 100 mg/dL)  Hemoglobin A1c     Status: None   Collection Time: 11/13/19 11:35 AM  Result Value Ref Range   Hgb A1c MFr Bld 5.3 4.6 - 6.5 %    Comment: Glycemic Control Guidelines for People with Diabetes:Non Diabetic:  <6%Goal of Therapy: <7%Additional Action Suggested:  >8%     ROS: 14 pt review of systems performed and negative (unless mentioned in an HPI)  Objective: BP 119/78 (BP Location: Right Arm, Patient Position: Sitting, Cuff Size: Large)   Pulse 69   Temp 98.2 F (36.8 C) (Temporal)   Ht 5' 9"  (1.753 m)   Wt 270 lb 9.6 oz (122.7 kg)   SpO2 99%   BMI 39.96  kg/m  Gen: Afebrile. No acute distress. Nontoxic in appearance, well-developed, well-nourished, very pleasant, obese Caucasian female HENT: AT. [place]. Bilateral TM visualized and normal in appearance, normal external auditory canal. MMM, no oral lesions, adequate dentition. Bilateral nares within normal limits. Throat without erythema, ulcerations or exudates.  No cough on exam, no hoarseness on exam. Eyes:Pupils Equal Round Reactive to light, Extraocular movements intact,  Conjunctiva without redness, discharge or icterus. Neck/lymp/endocrine: Supple, no lymphadenopathy, no thyromegaly CV: RRR no murmur, no edema, +2/4 P posterior tibialis pulses. No JVD. Chest: CTAB, no wheeze, rhonchi or crackles.  Normal respiratory effort.  Good air movement. Abd: Soft.  Obese. NTND. BS present.  No masses palpated. No hepatosplenomegaly. No rebound tenderness or guarding. Skin: No rashes, purpura or petechiae. Warm and well-perfused. Skin intact. Neuro/Msk:  Normal gait. PERLA. EOMi. Alert. Oriented x3.  Cranial nerves II through XII intact. Muscle strength 5/5 upper/lower extremity. DTRs equal bilaterally. Psych: Normal affect, dress and demeanor. Normal speech. Normal thought  content and judgment.   No exam data present  Assessment/plan: LANAYA BENNIS is a 59 y.o. female present for  Gastroesophageal reflux disease without esophagitis/long-term PPI use Continue omeprazole 20 mg twice daily - B12 - Vitamin D (25 hydroxy) - Magnesium  Obesity (BMI 30-39.9) Routine exercise and dietary modifications encouraged - Lipid panel  Encounter for hepatitis C screening test for low risk patient - Hepatitis C Antibody  Encounter for screening for HIV - HIV antibody (with reflex)  Encounter for long-term (current) use of medications - Comp Met (CMET)  Diabetes mellitus screening - Hemoglobin A1c  Screening for deficiency anemia - CBC  Arthralgia, unspecified joint -Restart Mobic 15 mg daily with meal.  Patient has tolerated in this past without negative side effects with her reflux. - TSH  Need for shingles vaccine Nurse visit 2-6 months for Shingrix No. 2 - Varicella-zoster vaccine IM  Encounter for preventive health examination Patient was encouraged to exercise greater than 150 minutes a week. Patient was encouraged to choose a diet filled with fresh fruits and vegetables, and lean meats. AVS provided to patient today for education/recommendation on gender specific health and safety maintenance. Colonoscopy: completed 2014,  follow up 2024.Dr. Paulita Fujita Mammogram: completed: 09/10/2019. Dr. Lynnette Caffey Cervical cancer screening: N/A Immunizations: tdap UTD 2013, Influenza - allergic, shingrix #1 today (nurse visit 2-6 mos).  Covid series completed Infectious disease screening: HIV and  Hep C screening desired.  DEXA: Completed at Dr. Lynnette Caffey- reported as normal  Return in about 1 year (around 11/12/2020) for CPE (30 min).  Orders Placed This Encounter  Procedures  . Varicella-zoster vaccine IM  . B12  . Vitamin D (25 hydroxy)  . Magnesium  . CBC  . Comp Met (CMET)  . TSH  . Lipid panel  . Hemoglobin A1c  . HIV antibody (with reflex)  . Hepatitis C  Antibody    Meds ordered this encounter  Medications  . meloxicam (MOBIC) 15 MG tablet    Sig: Take 1 tablet (15 mg total) by mouth daily.    Dispense:  90 tablet    Refill:  3  . omeprazole (PRILOSEC) 20 MG capsule    Sig: Take 1 capsule (20 mg total) by mouth 2 (two) times daily before a meal.    Dispense:  180 capsule    Refill:  3   Referral Orders  No referral(s) requested today     Electronically signed by: Howard Pouch, [place]

## 2019-11-14 LAB — HEPATITIS C ANTIBODY
Hepatitis C Ab: NONREACTIVE
SIGNAL TO CUT-OFF: 0.01 (ref <1.00)

## 2019-11-14 LAB — HIV ANTIBODY (ROUTINE TESTING W REFLEX): HIV 1&2 Ab, 4th Generation: NONREACTIVE

## 2019-11-16 ENCOUNTER — Encounter: Payer: Self-pay | Admitting: Family Medicine

## 2019-11-17 ENCOUNTER — Telehealth: Payer: Self-pay | Admitting: Family Medicine

## 2019-11-17 NOTE — Telephone Encounter (Signed)
Please inform patient the following information: Liver, kidney and thyroid function are normal Blood cell counts and electrolytes are normal Hep c and HIV screenings negative Diabetes screening/A1c is normal at 5.3 Cholesterol pane is within normal range Vit d is in normal range b12 is very low at 282. I would encourage her to start B12 ~2000 (or more) mcg sublingual solution QD. Desired levels over 500. Decreased b12 can cause decreased energy, headaches and muscle/nerve aches.

## 2019-11-17 NOTE — Telephone Encounter (Signed)
MyChart message read.

## 2019-11-26 ENCOUNTER — Telehealth: Payer: Self-pay

## 2019-11-26 NOTE — Telephone Encounter (Signed)
Patient has contacted her insurance company about 10/28/19 visit. They do not cover code [id number]. Patient is asking for visit to be reviewed and to change the code and to resubmit.

## 2019-12-11 ENCOUNTER — Encounter: Payer: Self-pay | Admitting: Family Medicine

## 2019-12-12 ENCOUNTER — Other Ambulatory Visit: Payer: Self-pay

## 2019-12-12 DIAGNOSIS — M2550 Pain in unspecified joint: Secondary | ICD-10-CM

## 2019-12-12 DIAGNOSIS — K219 Gastro-esophageal reflux disease without esophagitis: Secondary | ICD-10-CM

## 2019-12-12 MED ORDER — MELOXICAM 15 MG PO TABS: 15.0000 mg | ORAL_TABLET | Freq: Every day | ORAL | 3 refills | Status: DC

## 2019-12-12 MED ORDER — OMEPRAZOLE 20 MG PO CPDR: 20.0000 mg | DELAYED_RELEASE_CAPSULE | Freq: Two times a day (BID) | ORAL | 3 refills | Status: DC

## 2019-12-12 NOTE — Telephone Encounter (Signed)
[facility] and [facility] the [facility]. Thanks.

## 2020-02-13 ENCOUNTER — Ambulatory Visit (INDEPENDENT_AMBULATORY_CARE_PROVIDER_SITE_OTHER): Payer: BC Managed Care – PPO

## 2020-02-13 ENCOUNTER — Other Ambulatory Visit: Payer: Self-pay

## 2020-02-13 DIAGNOSIS — Z23 Encounter for immunization: Secondary | ICD-10-CM

## 2020-02-20 ENCOUNTER — Ambulatory Visit: Payer: BC Managed Care – PPO

## 2020-02-24 ENCOUNTER — Telehealth (INDEPENDENT_AMBULATORY_CARE_PROVIDER_SITE_OTHER): Payer: BC Managed Care – PPO | Admitting: Family Medicine

## 2020-02-24 ENCOUNTER — Encounter: Payer: Self-pay | Admitting: Family Medicine

## 2020-02-24 DIAGNOSIS — K529 Noninfective gastroenteritis and colitis, unspecified: Secondary | ICD-10-CM

## 2020-02-24 DIAGNOSIS — R110 Nausea: Secondary | ICD-10-CM

## 2020-02-24 MED ORDER — ONDANSETRON HCL 4 MG PO TABS
4.0000 mg | ORAL_TABLET | Freq: Three times a day (TID) | ORAL | 0 refills | Status: DC | PRN
Start: 2020-02-24 — End: 2020-11-15

## 2020-02-24 MED ORDER — PROMETHAZINE HCL 25 MG PO TABS
25.0000 mg | ORAL_TABLET | Freq: Three times a day (TID) | ORAL | 0 refills | Status: DC | PRN
Start: 2020-02-24 — End: 2020-11-15

## 2020-02-24 NOTE — Patient Instructions (Signed)

## 2020-02-24 NOTE — Progress Notes (Signed)
VIRTUAL VISIT VIA VIDEO  I connected with Kyra Manges on 02/24/20 at  2:00 PM EST by elemedicine application and verified that I am speaking with the correct person using two identifiers. Location patient: Home Location provider: [facility], Office Persons participating in the virtual visit: Patient, Dr. Claiborne Billings and Paulina Fusi, CMA  I discussed the limitations of evaluation and management by telemedicine and the availability of in person appointments. The patient expressed understanding and agreed to proceed.  Interactive audio and video telecommunications were attempted between this provider and patient, however failed, due to patient having technical difficulties. We continued and completed visit with audio only.    SUBJECTIVE Chief Complaint  Patient presents with  . Nausea    pt rec'd rx from an [facility] over a yr ago; pt is nausea intermittently throughout the year    HPI: Joanne [name] is a 59 y.o. present for nausea. Pt reports she has a stomach bug. She has been prescribed phenergan 25 in the past to help with nausea and would like a refill on that medication today. She did get tested for COVID and was negative.  ROS: See pertinent positives and negatives per HPI.  Patient Active Problem List   Diagnosis Date Noted  . Encounter for monitoring long-term proton pump inhibitor therapy 11/13/2019  . Arthralgia 11/13/2019  . Gastroesophageal reflux disease without esophagitis 10/28/2019  . Obesity (BMI 30-39.9) 10/28/2019  . Piriformis syndrome of right side 05/29/2019    Social History   Tobacco Use  . Smoking status: Never Smoker  . Smokeless tobacco: Never Used  Substance Use Topics  . Alcohol use: Yes    Comment: occasional    Current Outpatient Medications:  .  Adapalene-Benzoyl Peroxide 0.1-2.5 % gel, Apply topically daily., Disp: , Rfl:  .  cetirizine (ZYRTEC) 10 MG tablet, Take 10 mg by mouth daily. Patient uses this medication for her  allergies., Disp: , Rfl:  .  meloxicam (MOBIC) 15 MG tablet, Take 1 tablet (15 mg total) by mouth daily., Disp: 90 tablet, Rfl: 3 .  omeprazole (PRILOSEC) 20 MG capsule, Take 1 capsule (20 mg total) by mouth 2 (two) times daily before a meal., Disp: 180 capsule, Rfl: 3 .  spironolactone (ALDACTONE) 25 MG tablet, Take 25 mg by mouth in the morning and at bedtime., Disp: , Rfl:  .  albuterol (PROVENTIL HFA;VENTOLIN HFA) 108 (90 Base) MCG/ACT inhaler, Inhale 2 puffs into the lungs every 4 (four) hours as needed for wheezing or shortness of breath. (Patient not taking: Reported on 02/24/2020), Disp: 1 Inhaler, Rfl: 0 .  ondansetron (ZOFRAN) 4 MG tablet, Take 1 tablet (4 mg total) by mouth every 8 (eight) hours as needed for nausea or vomiting., Disp: 20 tablet, Rfl: 0 .  promethazine (PHENERGAN) 25 MG tablet, Take 1 tablet (25 mg total) by mouth every 8 (eight) hours as needed for nausea or vomiting., Disp: 20 tablet, Rfl: 0 No current facility-administered medications for this visit.  Facility-Administered Medications Ordered in Other Visits:  .  gadobutrol (GADAVIST) 1 MMOL/ML injection 10 mL, 10 mL, Intravenous, Once PRN, Delford Field, Kristen M, PA-C  Allergies  Allergen Reactions  . Penicillins Other (See Comments)    GI upset  . Tape Other (See Comments)    Welts and pain    OBJECTIVE: There were no vitals taken for this visit. Gen: No acute distress. Nontoxic in appearance.  HENT: AT. [place].  MMM.  Eyes:Pupils Equal Round Reactive to light, Extraocular  movements intact,  Conjunctiva without redness, discharge or icterus. Chest: Cough not present Skin: no rashes, purpura or petechiae.  Neuro: Alert. Oriented x3  Psych: Normal affect and demeanor. Normal speech. Normal thought content and judgment.  ASSESSMENT AND PLAN: VERTIS BAUDER is a 59 y.o. female present for  Nausea/Gastroenteritis Rest.hydrate. Bland diet phenergan 25 prescribed.  zofran prescribed> pt to try first.  F/u  prn   Felix Pacini, DO 02/24/2020   Return if symptoms worsen or fail to improve.  No orders of the defined types were placed in this encounter.  Meds ordered this encounter  Medications  . promethazine (PHENERGAN) 25 MG tablet    Sig: Take 1 tablet (25 mg total) by mouth every 8 (eight) hours as needed for nausea or vomiting.    Dispense:  20 tablet    Refill:  0  . ondansetron (ZOFRAN) 4 MG tablet    Sig: Take 1 tablet (4 mg total) by mouth every 8 (eight) hours as needed for nausea or vomiting.    Dispense:  20 tablet    Refill:  0   Referral Orders  No referral(s) requested today

## 2020-02-27 ENCOUNTER — Ambulatory Visit: Payer: BC Managed Care – PPO

## 2020-08-03 ENCOUNTER — Other Ambulatory Visit: Payer: Self-pay | Admitting: Obstetrics & Gynecology

## 2020-08-03 DIAGNOSIS — Z1231 Encounter for screening mammogram for malignant neoplasm of breast: Secondary | ICD-10-CM

## 2020-09-21 ENCOUNTER — Ambulatory Visit
Admission: RE | Admit: 2020-09-21 | Discharge: 2020-09-21 | Disposition: A | Discharge status: Home / Self Care | Payer: BC Managed Care – PPO | Source: Ambulatory Visit | Attending: Obstetrics & Gynecology | Admitting: Obstetrics & Gynecology

## 2020-09-21 ENCOUNTER — Other Ambulatory Visit: Payer: Self-pay

## 2020-09-21 DIAGNOSIS — Z1231 Encounter for screening mammogram for malignant neoplasm of breast: Secondary | ICD-10-CM

## 2020-11-15 ENCOUNTER — Ambulatory Visit (INDEPENDENT_AMBULATORY_CARE_PROVIDER_SITE_OTHER): Payer: BC Managed Care – PPO | Admitting: Family Medicine

## 2020-11-15 ENCOUNTER — Encounter: Payer: Self-pay | Admitting: Family Medicine

## 2020-11-15 ENCOUNTER — Other Ambulatory Visit: Payer: Self-pay

## 2020-11-15 VITALS — BP 118/76 | HR 79 | Temp 97.7°F | Ht 68.5 in | Wt 268.0 lb

## 2020-11-15 DIAGNOSIS — E538 Deficiency of other specified B group vitamins: Secondary | ICD-10-CM | POA: Diagnosis not present

## 2020-11-15 DIAGNOSIS — M2550 Pain in unspecified joint: Secondary | ICD-10-CM | POA: Diagnosis not present

## 2020-11-15 DIAGNOSIS — Z131 Encounter for screening for diabetes mellitus: Secondary | ICD-10-CM | POA: Diagnosis not present

## 2020-11-15 DIAGNOSIS — Z23 Encounter for immunization: Secondary | ICD-10-CM

## 2020-11-15 DIAGNOSIS — Z130 Encounter for screening for diseases of the blood and blood-forming organs and certain disorders involving the immune mechanism: Secondary | ICD-10-CM

## 2020-11-15 DIAGNOSIS — Z79899 Other long term (current) drug therapy: Secondary | ICD-10-CM | POA: Diagnosis not present

## 2020-11-15 DIAGNOSIS — K219 Gastro-esophageal reflux disease without esophagitis: Secondary | ICD-10-CM | POA: Diagnosis not present

## 2020-11-15 DIAGNOSIS — Z8249 Family history of ischemic heart disease and other diseases of the circulatory system: Secondary | ICD-10-CM | POA: Insufficient documentation

## 2020-11-15 DIAGNOSIS — Z0001 Encounter for general adult medical examination with abnormal findings: Secondary | ICD-10-CM | POA: Diagnosis not present

## 2020-11-15 DIAGNOSIS — Z5181 Encounter for therapeutic drug level monitoring: Secondary | ICD-10-CM

## 2020-11-15 LAB — LIPID PANEL
Cholesterol: 214 mg/dL — ABNORMAL HIGH (ref 0–200)
HDL: 45.5 mg/dL (ref >39.00)
LDL Cholesterol: 142 mg/dL — ABNORMAL HIGH (ref 0–99)
NonHDL: 168.87
Total CHOL/HDL Ratio: 5
Triglycerides: 135 mg/dL (ref 0.0–149.0)
VLDL: 27 mg/dL (ref 0.0–40.0)

## 2020-11-15 LAB — CBC WITH DIFFERENTIAL/PLATELET
Basophils Absolute: 0 10*3/uL (ref 0.0–0.1)
Basophils Relative: 0.4 % (ref 0.0–3.0)
Eosinophils Absolute: 0.1 10*3/uL (ref 0.0–0.7)
Eosinophils Relative: 1.7 % (ref 0.0–5.0)
HCT: 38.9 % (ref 36.0–46.0)
Hemoglobin: 12.9 g/dL (ref 12.0–15.0)
Lymphocytes Relative: 29.7 % (ref 12.0–46.0)
Lymphs Abs: 1.1 10*3/uL (ref 0.7–4.0)
MCHC: 33.1 g/dL (ref 30.0–36.0)
MCV: 92.6 fl (ref 78.0–100.0)
Monocytes Absolute: 0.3 10*3/uL (ref 0.1–1.0)
Monocytes Relative: 7.7 % (ref 3.0–12.0)
Neutro Abs: 2.2 10*3/uL (ref 1.4–7.7)
Neutrophils Relative %: 60.5 % (ref 43.0–77.0)
Platelets: 227 10*3/uL (ref 150.0–400.0)
RBC: 4.2 Mil/uL (ref 3.87–5.11)
RDW: 13.4 % (ref 11.5–15.5)
WBC: 3.7 10*3/uL — ABNORMAL LOW (ref 4.0–10.5)

## 2020-11-15 LAB — COMPREHENSIVE METABOLIC PANEL
ALT: 25 U/L (ref 0–35)
AST: 21 U/L (ref 0–37)
Albumin: 4.4 g/dL (ref 3.5–5.2)
Alkaline Phosphatase: 72 U/L (ref 39–117)
BUN: 17 mg/dL (ref 6–23)
CO2: 27 mEq/L (ref 19–32)
Calcium: 9.6 mg/dL (ref 8.4–10.5)
Chloride: 103 mEq/L (ref 96–112)
Creatinine, Ser: 0.85 mg/dL (ref 0.40–1.20)
GFR: 74.93 mL/min (ref >60.00)
Glucose, Bld: 84 mg/dL (ref 70–99)
Potassium: 4.2 mEq/L (ref 3.5–5.1)
Sodium: 140 mEq/L (ref 135–145)
Total Bilirubin: 0.5 mg/dL (ref 0.2–1.2)
Total Protein: 7.2 g/dL (ref 6.0–8.3)

## 2020-11-15 LAB — VITAMIN B12: Vitamin B-12: >1550 pg/mL — ABNORMAL HIGH (ref 211–911)

## 2020-11-15 LAB — TSH: TSH: 3.36 u[IU]/mL (ref 0.35–5.50)

## 2020-11-15 LAB — HEMOGLOBIN A1C: Hgb A1c MFr Bld: 5.1 % (ref 4.6–6.5)

## 2020-11-15 MED ORDER — OMEPRAZOLE 20 MG PO CPDR: 20.0000 mg | DELAYED_RELEASE_CAPSULE | Freq: Two times a day (BID) | ORAL | 3 refills | Status: AC

## 2020-11-15 MED ORDER — MELOXICAM 15 MG PO TABS: 15.0000 mg | ORAL_TABLET | Freq: Every day | ORAL | 3 refills | Status: AC

## 2020-11-15 MED ORDER — PROMETHAZINE HCL 25 MG PO TABS: 25.0000 mg | ORAL_TABLET | Freq: Three times a day (TID) | ORAL | 0 refills | Status: AC | PRN

## 2020-11-15 NOTE — Progress Notes (Signed)
This visit occurred during the SARS-CoV-2 public health emergency.  Safety protocols were in place, including screening questions prior to the visit, additional usage of staff PPE, and extensive cleaning of exam room while observing appropriate contact time as indicated for disinfecting solutions.    Patient ID: Joanne [name], female  DOB: [birth year], 60 y.o.   MRN: [id number] Patient Care Team    Relationship Specialty Notifications Start End  Ma Hillock, DO PCP - General Family Medicine  10/28/19   Jaslyne Hedges, DO Consulting Physician Obstetrics and Gynecology  10/28/19   Arta Silence, MD Consulting Physician Gastroenterology  10/28/19   Lyndal Pulley, Kimberly Physician Sports Medicine  10/28/19   Orbie Hurst, MD Referring Physician Dermatology  10/28/19     Chief Complaint  Patient presents with   Annual Exam    Pt is fasting    Subjective: Joanne [name] is a 60 y.o.  Female  present for CPE/CMC. All past medical history, surgical history, allergies, family history, immunizations, medications and social history were updated in the electronic medical record today. All recent labs, ED visits and hospitalizations within the last year were reviewed.  Health maintenance:  Colonoscopy: completed 2014,  follow up 2024.Dr. Paulita Fujita Mammogram: completed: 09/2020. Dr. Lynnette Caffey Cervical cancer screening: N/A Immunizations: tdap UTD 2013, Influenza - allergic, shingrix series completed. Covid series completed-booster x2 Infectious disease screening: HIV and  Hep C completed DEXA: Completed at Dr. Lynnette Caffey- reported as normal Assistive device: None Oxygen use: None Patient has a Dental home. Hospitalizations/ED visits: Reviewed   Low back pain: She reports compliance with daily mobic. She feels it is very helpful and has not worsened her GERD.  Prior note: Patient reports she had been on Mobic in the past.  She has not used the medication over the summer and was trying to come  off the medication permanently but found that she is having increasing low back pain and would like to restart medication.   GERD/long-term PPI use: She reports compliance with omeprazole and feels it is working well. She uses phenergan as needed only. Rarely needs. Zofran was not as helpful and caused her constipation.  Prior note: Patient has been on omeprazole twice daily for many years.  She has been unable to taper off medication secondary to reoccurring symptoms.  Depression screen [facility] 2/9 11/15/2020 10/28/2019  Decreased Interest 0 0  Down, Depressed, Hopeless 0 0  PHQ - 2 Score 0 0  Altered sleeping - 0  Tired, decreased energy - 0  Change in appetite - 2  Feeling bad or failure about yourself  - 0  Trouble concentrating - 0  Moving slowly or fidgety/restless - 0  Suicidal thoughts - 0  PHQ-9 Score - 2  Difficult doing work/chores - Not difficult at all   No flowsheet data found.         Immunization History  Administered Date(s) Administered   MMR 08/08/2001   PFIZER(Purple Top)SARS-COV-2 Vaccination 06/11/2019, 07/09/2019, 01/20/2020, 10/17/2020   Tdap 08/07/2011   Zoster Recombinat (Shingrix) 11/13/2019, 02/13/2020     Past Medical History:  Diagnosis Date   Arthritis    Diverticulitis    Endometriosis    Abdominal hysterectomy   GERD (gastroesophageal reflux disease) 06/2009   EGD with small gastric and duodenal erosions/inflammation.   Hamstring injury 2014   Right hamstring tear   Hay fever    Migraines    Allergies  Allergen Reactions   Penicillins Other (See Comments)  GI upset   Tape Other (See Comments)    Welts and pain   Past Surgical History:  Procedure Laterality Date   ABDOMINAL HYSTERECTOMY  1998   Endometriosis   APPENDECTOMY  1986   BREAST EXCISIONAL BIOPSY Right 2005   CHOLECYSTECTOMY  2010   ESOPHAGOGASTRODUODENOSCOPY  06/2009   EGD with small gastric and duodenal erosions/inflammation.   EXPLORATORY LAPAROTOMY  1988   x3  D/T endometriosis   OVARIAN CYST REMOVAL  08/2007   Family History  Problem Relation Age of Onset   Breast cancer Mother    Arthritis Mother    Hypertension Mother    Miscarriages / [place] Mother    Diabetes Mother    Arthritis Father    Heart attack Father    Diabetes Father    Heart disease Father    Early death Father    Social History   Social History Narrative   Marital status/children/pets: Married.  G2 P2   Education/employment: College degree, employed as a Adult nurse:      -Wears a bicycle helmet riding a bike: Yes     -smoke alarm in the home:Yes     - wears seatbelt: Yes     - Feels safe in their relationships: Yes    Allergies as of 11/15/2020       Reactions   Penicillins Other (See Comments)   GI upset   Tape Other (See Comments)   Welts and pain        Medication List        Accurate as of November 15, 2020  9:03 AM. If you have any questions, ask your nurse or doctor.          STOP taking these medications    ondansetron 4 MG tablet Commonly known as: ZOFRAN Stopped by: Howard Pouch, DO       TAKE these medications    Adapalene-Benzoyl Peroxide 0.1-2.5 % gel Apply topically daily.   albuterol 108 (90 Base) MCG/ACT inhaler Commonly known as: VENTOLIN HFA Inhale 2 puffs into the lungs every 4 (four) hours as needed for wheezing or shortness of breath.   cetirizine 10 MG tablet Commonly known as: ZYRTEC Take 10 mg by mouth daily. Patient uses this medication for her allergies.   meloxicam 15 MG tablet Commonly known as: MOBIC Take 1 tablet (15 mg total) by mouth daily.   omeprazole 20 MG capsule Commonly known as: PRILOSEC Take 1 capsule (20 mg total) by mouth 2 (two) times daily before a meal.   promethazine 25 MG tablet Commonly known as: PHENERGAN Take 1 tablet (25 mg total) by mouth every 8 (eight) hours as needed for nausea or vomiting.   spironolactone 25 MG tablet Commonly known as: ALDACTONE Take 25 mg  by mouth in the morning and at bedtime.        All past medical history, surgical history, allergies, family history, immunizations andmedications were updated in the EMR today and reviewed under the history and medication portions of their EMR.     No results found for this or any previous visit (from the past 2160 hour(s)).  MM 3D SCREEN BREAST BILATERAL Result Date: 09/23/2020  BI-RADS CATEGORY  1: Negative. Electronically Signed   By: Dorise Bullion III M.D   On: 09/23/2020 13:59   ROS: 14 pt review of systems performed and negative (unless mentioned in an HPI)  Objective: BP 118/76   Pulse 79   Temp 97.7 F (36.5 C) (  Oral)   Ht 5' 8.5" (1.74 m)   Wt 268 lb (121.6 kg)   SpO2 98%   BMI 40.16 kg/m  Gen: Afebrile. No acute distress. Nontoxic in appearance, well-developed, well-nourished,  very pleasant obese female.  HENT: AT. [place]. Bilateral TM visualized and normal in appearance, normal external auditory canal. MMM, no oral lesions, adequate dentition. Bilateral nares within normal limits. Throat without erythema, ulcerations or exudates. no Cough on exam, no hoarseness on exam. Eyes:Pupils Equal Round Reactive to light, Extraocular movements intact,  Conjunctiva without redness, discharge or icterus. Neck/lymp/endocrine: Supple,no lymphadenopathy, no thyromegaly CV: RRR no murmur, no edema, +2/4 P posterior tibialis pulses.  Chest: CTAB, no wheeze, rhonchi or crackles. normal Respiratory effort. good Air movement. Abd: Soft. flat. NTND. BS present. no Masses palpated. No hepatosplenomegaly. No rebound tenderness or guarding. Skin: no rashes, purpura or petechiae. Warm and well-perfused. Skin intact. Neuro/Msk:  Normal gait. PERLA. EOMi. Alert. Oriented x3.  Cranial nerves II through XII intact. Muscle strength 5/5 upper/lower extremity. DTRs equal bilaterally. Psych: Normal affect, dress and demeanor. Normal speech. Normal thought content and judgment.   No results  found.  Assessment/plan: Joanne [name] is a 60 y.o. female present for CPE/CMC Gastroesophageal reflux disease without esophagitis/long-term PPI use/b12 def Continue omeprazole 20 mg twice daily - B12 level collected today. She is taking oral supplement- discussed use of subL.  Vit d and mag UTD- due next year.    Arthralgia, unspecified joint - stable.  - continue Mobic 15 mg daily with meal.   - TSH   Morbid obesity: - Lipid panel Encounter for long-term current use of medication - Comprehensive metabolic panel Screening for deficiency anemia - CBC with Differential/Platelet Diabetes mellitus screening - Hemoglobin A1c B12 deficiency - B12  Encounter for general adult medical examination with abnormal findings Colonoscopy: completed 2014,  follow up 2024.Dr. Paulita Fujita Mammogram: completed: 09/2020. Dr. Lynnette Caffey Cervical cancer screening: N/A Immunizations: tdap UTD 2013, Influenza - allergic, shingrix series completed. Covid series completed-booster x2 Infectious disease screening: HIV and  Hep C completed DEXA: Completed at Dr. Lynnette Caffey- reported as normal Patient was encouraged to exercise greater than 150 minutes a week. Patient was encouraged to choose a diet filled with fresh fruits and vegetables, and lean meats. AVS provided to patient today for education/recommendation on gender specific health and safety maintenance. Return in 1 year (on 11/15/2021) for CPE (30 min), sooner if desiring weight loss counseling. .  Orders Placed This Encounter  Procedures   Tdap vaccine greater than or equal to 7yo IM   CBC with Differential/Platelet   Comprehensive metabolic panel   Hemoglobin A1c   Lipid panel   TSH   B12   Meds ordered this encounter  Medications   meloxicam (MOBIC) 15 MG tablet    Sig: Take 1 tablet (15 mg total) by mouth daily.    Dispense:  90 tablet    Refill:  3   omeprazole (PRILOSEC) 20 MG capsule    Sig: Take 1 capsule (20 mg total) by mouth 2 (two)  times daily before a meal.    Dispense:  180 capsule    Refill:  3   promethazine (PHENERGAN) 25 MG tablet    Sig: Take 1 tablet (25 mg total) by mouth every 8 (eight) hours as needed for nausea or vomiting.    Dispense:  20 tablet    Refill:  0   Referral Orders  No referral(s) requested today     Electronically signed by:  Howard Pouch, DO [place]

## 2020-11-15 NOTE — Patient Instructions (Signed)
Health Maintenance, Female Adopting a healthy lifestyle and getting preventive care are important in promoting health and wellness. Ask your health care provider about: The right schedule for you to have regular tests and exams. Things you can do on your own to prevent diseases and keep yourself healthy. What should I know about diet, weight, and exercise? Eat a healthy diet  Eat a diet that includes plenty of vegetables, fruits, low-fat dairy products, and lean protein. Do not eat a lot of foods that are high in solid fats, added sugars, or sodium.  Maintain a healthy weight Body mass index (BMI) is used to identify weight problems. It estimates body fat based on height and weight. Your health care provider can help determineyour BMI and help you achieve or maintain a healthy weight. Get regular exercise Get regular exercise. This is one of the most important things you can do for your health. Most adults should: Exercise for at least 150 minutes each week. The exercise should increase your heart rate and make you sweat (moderate-intensity exercise). Do strengthening exercises at least twice a week. This is in addition to the moderate-intensity exercise. Spend less time sitting. Even light physical activity can be beneficial. Watch cholesterol and blood lipids Have your blood tested for lipids and cholesterol at 59 years of age, then havethis test every 5 years. Have your cholesterol levels checked more often if: Your lipid or cholesterol levels are high. You are older than 59 years of age. You are at high risk for heart disease. What should I know about cancer screening? Depending on your health history and family history, you may need to have cancer screening at various ages. This may include screening for: Breast cancer. Cervical cancer. Colorectal cancer. Skin cancer. Lung cancer. What should I know about heart disease, diabetes, and high blood pressure? Blood pressure and heart  disease High blood pressure causes heart disease and increases the risk of stroke. This is more likely to develop in people who have high blood pressure readings, are of African descent, or are overweight. Have your blood pressure checked: Every 3-5 years if you are 18-39 years of age. Every year if you are 40 years old or older. Diabetes Have regular diabetes screenings. This checks your fasting blood sugar level. Have the screening done: Once every three years after age 40 if you are at a normal weight and have a low risk for diabetes. More often and at a younger age if you are overweight or have a high risk for diabetes. What should I know about preventing infection? Hepatitis B If you have a higher risk for hepatitis B, you should be screened for this virus. Talk with your health care provider to find out if you are at risk forhepatitis B infection. Hepatitis C Testing is recommended for: Everyone born from 1945 through 1965. Anyone with known risk factors for hepatitis C. Sexually transmitted infections (STIs) Get screened for STIs, including gonorrhea and chlamydia, if: You are sexually active and are younger than 59 years of age. You are older than 59 years of age and your health care provider tells you that you are at risk for this type of infection. Your sexual activity has changed since you were last screened, and you are at increased risk for chlamydia or gonorrhea. Ask your health care provider if you are at risk. Ask your health care provider about whether you are at high risk for HIV. Your health care provider may recommend a prescription medicine to help   prevent HIV infection. If you choose to take medicine to prevent HIV, you should first get tested for HIV. You should then be tested every 3 months for as long as you are taking the medicine. Pregnancy If you are about to stop having your period (premenopausal) and you may become pregnant, seek counseling before you get  pregnant. Take 400 to 800 micrograms (mcg) of folic acid every day if you become pregnant. Ask for birth control (contraception) if you want to prevent pregnancy. Osteoporosis and menopause Osteoporosis is a disease in which the bones lose minerals and strength with aging. This can result in bone fractures. If you are 65 years old or older, or if you are at risk for osteoporosis and fractures, ask your health care provider if you should: Be screened for bone loss. Take a calcium or vitamin D supplement to lower your risk of fractures. Be given hormone replacement therapy (HRT) to treat symptoms of menopause. Follow these instructions at home: Lifestyle Do not use any products that contain nicotine or tobacco, such as cigarettes, e-cigarettes, and chewing tobacco. If you need help quitting, ask your health care provider. Do not use street drugs. Do not share needles. Ask your health care provider for help if you need support or information about quitting drugs. Alcohol use Do not drink alcohol if: Your health care provider tells you not to drink. You are pregnant, may be pregnant, or are planning to become pregnant. If you drink alcohol: Limit how much you use to 0-1 drink a day. Limit intake if you are breastfeeding. Be aware of how much alcohol is in your drink. In the [place] one drink equals one 12 oz bottle of beer (355 mL), one 5 oz glass of wine (148 mL), or one 1 oz glass of hard liquor (44 mL). General instructions Schedule regular health, dental, and eye exams. Stay current with your vaccines. Tell your health care provider if: You often feel depressed. You have ever been abused or do not feel safe at home. Summary Adopting a healthy lifestyle and getting preventive care are important in promoting health and wellness. Follow your health care provider's instructions about healthy diet, exercising, and getting tested or screened for diseases. Follow your health care provider's  instructions on monitoring your cholesterol and blood pressure. This information is not intended to replace advice given to you by your health care provider. Make sure you discuss any questions you have with your healthcare provider. Document Revised: 03/20/2018 Document Reviewed: 03/20/2018 Elsevier Patient Education  2022 Elsevier Inc.  

## 2020-12-03 ENCOUNTER — Other Ambulatory Visit: Payer: Self-pay

## 2020-12-03 ENCOUNTER — Ambulatory Visit: Payer: BC Managed Care – PPO | Admitting: Family Medicine

## 2020-12-03 ENCOUNTER — Encounter: Payer: Self-pay | Admitting: Family Medicine

## 2020-12-03 VITALS — BP 114/75 | HR 73 | Temp 97.2°F | Ht 69.0 in | Wt 269.0 lb

## 2020-12-03 DIAGNOSIS — Z713 Dietary counseling and surveillance: Secondary | ICD-10-CM | POA: Diagnosis not present

## 2020-12-03 DIAGNOSIS — E669 Obesity, unspecified: Secondary | ICD-10-CM | POA: Diagnosis not present

## 2020-12-03 NOTE — Progress Notes (Signed)
This visit occurred during the SARS-CoV-2 public health emergency.  Safety protocols were in place, including screening questions prior to the visit, additional usage of staff PPE, and extensive cleaning of exam room while observing appropriate contact time as indicated for disinfecting solutions.    Joanne [name] , 07/28/1960, 60 y.o., female MRN: [id number] Patient Care Team    Relationship Specialty Notifications Start End  Natalia Leatherwood, DO PCP - General Family Medicine  10/28/19   Mitchel Honour, DO Consulting Physician Obstetrics and Gynecology  10/28/19   Willis Modena, MD Consulting Physician Gastroenterology  10/28/19   Judi Saa, DO Consulting Physician Sports Medicine  10/28/19   Reginia Naas, MD Referring Physician Dermatology  10/28/19     Chief Complaint  Patient presents with   Weight Loss    Pt is not fasting      Subjective: Pt presents for an OV to today to discuss weight loss. Start weight/date:269- 12/03/2020 We reviewed patient's normal dietary consumption and choice of foods, along with her average calorie calculations.  She drinks approximately 30 ounces of water a day.  She does choose to eat lean meats that are grilled and prepared healthy.  Unfortunately she also leans towards hamburgers, hot dogs and pizza on occasions for dinner.  She tries to incorporate a salad with her dinners. Her lunches consist of a [place] sandwich and celery/carrots and breakfast consists of half of a bagel with cream cheese.  She reports she takes in about 10,000 steps a day, but does not exercise routinely. She reports she has been tracking her calories and believes she takes in between 1200-1500 cal a day.  She reports she finds it hard to incorporate exercise into her daily routine.   Depression screen [facility] 2/9 11/15/2020 10/28/2019  Decreased Interest 0 0  Down, Depressed, Hopeless 0 0  PHQ - 2 Score 0 0  Altered sleeping - 0  Tired, decreased energy - 0  Change in  appetite - 2  Feeling bad or failure about yourself  - 0  Trouble concentrating - 0  Moving slowly or fidgety/restless - 0  Suicidal thoughts - 0  PHQ-9 Score - 2  Difficult doing work/chores - Not difficult at all    Allergies  Allergen Reactions   Penicillins Other (See Comments)    GI upset   Tape Other (See Comments)    Welts and pain   Social History   Social History Narrative   Marital status/children/pets: Married.  G2 P2   Education/employment: College degree, employed as a Solicitor:      -Wears a bicycle helmet riding a bike: Yes     -smoke alarm in the home:Yes     - wears seatbelt: Yes     - Feels safe in their relationships: Yes   Past Medical History:  Diagnosis Date   Arthritis    Diverticulitis    Endometriosis    Abdominal hysterectomy   GERD (gastroesophageal reflux disease) 06/2009   EGD with small gastric and duodenal erosions/inflammation.   Hamstring injury 2014   Right hamstring tear   Hay fever    Migraines    Past Surgical History:  Procedure Laterality Date   ABDOMINAL HYSTERECTOMY  1998   Endometriosis   APPENDECTOMY  1986   BREAST EXCISIONAL BIOPSY Right 2005   CHOLECYSTECTOMY  2010   ESOPHAGOGASTRODUODENOSCOPY  06/2009   EGD with small gastric and duodenal erosions/inflammation.   EXPLORATORY LAPAROTOMY  1988  x3 D/T endometriosis   OVARIAN CYST REMOVAL  08/2007   Family History  Problem Relation Age of Onset   Breast cancer Mother    Arthritis Mother    Hypertension Mother    Miscarriages / Stillbirths Mother    Diabetes Mother    Arthritis Father    Heart attack Father    Diabetes Father    Heart disease Father    Early death Father    Allergies as of 12/03/2020       Reactions   Penicillins Other (See Comments)   GI upset   Tape Other (See Comments)   Welts and pain        Medication List        Accurate as of December 03, 2020  4:59 PM. If you have any questions, ask your nurse or doctor.           Adapalene-Benzoyl Peroxide 0.1-2.5 % gel Apply topically daily.   albuterol 108 (90 Base) MCG/ACT inhaler Commonly known as: VENTOLIN HFA Inhale 2 puffs into the lungs every 4 (four) hours as needed for wheezing or shortness of breath.   cetirizine 10 MG tablet Commonly known as: ZYRTEC Take 10 mg by mouth daily. Patient uses this medication for her allergies.   meloxicam 15 MG tablet Commonly known as: MOBIC Take 1 tablet (15 mg total) by mouth daily.   omeprazole 20 MG capsule Commonly known as: PRILOSEC Take 1 capsule (20 mg total) by mouth 2 (two) times daily before a meal.   promethazine 25 MG tablet Commonly known as: PHENERGAN Take 1 tablet (25 mg total) by mouth every 8 (eight) hours as needed for nausea or vomiting.   spironolactone 25 MG tablet Commonly known as: ALDACTONE Take 25 mg by mouth in the morning and at bedtime.        All past medical history, surgical history, allergies, family history, immunizations andmedications were updated in the EMR today and reviewed under the history and medication portions of their EMR.     ROS: Negative, with the exception of above mentioned in HPI   Objective:  BP 114/75   Pulse 73   Temp (!) 97.2 F (36.2 C) (Oral)   Ht 5\' 9"  (1.753 m)   Wt 269 lb (122 kg)   SpO2 96%   BMI 39.72 kg/m  Body mass index is 39.72 kg/m. Gen: Afebrile. No acute distress. Nontoxic in appearance, well developed, well nourished.  HENT: AT. [place].  Eyes:Pupils Equal Round Reactive to light, Extraocular movements intact,  Conjunctiva without redness, discharge or icterus. Neuro: Normal gait. PERLA. EOMi. Alert. Oriented x3  Psych: Normal affect, dress and demeanor. Normal speech. Normal thought content and judgment.  No results found. No results found. No results found for this or any previous visit (from the past 24 hour(s)).  Assessment/Plan: Joanne [name] is a 60 y.o. female present for OV for  Obesity (BMI 30-39.9)/Weight  loss counseling, encounter for Patient was counseled on exercise, calorie counting, weight loss and potential medications to help with weight loss today. -Patient was provided with online resources for: Weekly net calorie calculator.  Applications for calorie counting.  Patient was advised to ensure she is taking in adequate nutrition daily by meeting calorie goals. -Patient was encouraged to start a daily fiber supplement twice daily. -Patient was educated on dietary changes to not only lose weight but to eat healthy.  Patient was educated on glycemic index. -Patient was encouraged to avoid flour, sugar, pasta, potatoes  and rice for the time being. Patient was educated on healthy carbohydrate choices. -Patient was educated on exercise goal of 150 minutes a week (plus warm up and cool down) of cardiovascular exercise.  Patient was educated on heart rate for cardiovascular and fat burning zones. -Patient was encouraged to maintain adequate water consumption of at least 80-100 ounces a day, more if exercising/sweating. Follow-up 4 to 6 weeks.  Reviewed expectations re: course of current medical issues. Discussed self-management of symptoms. Outlined signs and symptoms indicating need for more acute intervention. Patient verbalized understanding and all questions were answered. Patient received an After-Visit Summary.    No orders of the defined types were placed in this encounter.  No orders of the defined types were placed in this encounter.  Referral Orders  No referral(s) requested today    > 30 Minutes was dedicated to this patient's encounter   Note is dictated utilizing voice recognition software. Although note has been proof read prior to signing, occasional typographical errors still can be missed. If any questions arise, please do not hesitate to call for verification.   electronically signed by:  Felix Pacini, DO  [facility] [place]

## 2020-12-03 NOTE — Patient Instructions (Addendum)
https://www.calculator.net/calorie-calculator.html> weekly calorie calculator. Glycemic index fruits and veggies> choose lowest  Fiber supplement twice a day.  Water - 80- 100 ounces/d  Avoid pasta, flour, sugar, potato, rice  Exercise 150 min/week (at least to start)- Heart rate must go up > 120 and sweat/out of breath etc.

## 2021-01-05 ENCOUNTER — Other Ambulatory Visit: Payer: Self-pay

## 2021-01-05 ENCOUNTER — Encounter: Payer: Self-pay | Admitting: Family Medicine

## 2021-01-05 ENCOUNTER — Ambulatory Visit: Payer: BC Managed Care – PPO | Admitting: Family Medicine

## 2021-01-05 VITALS — BP 104/74 | HR 83 | Temp 97.4°F | Ht 69.0 in | Wt 258.0 lb

## 2021-01-05 DIAGNOSIS — W19XXXA Unspecified fall, initial encounter: Secondary | ICD-10-CM | POA: Diagnosis not present

## 2021-01-05 DIAGNOSIS — E669 Obesity, unspecified: Secondary | ICD-10-CM

## 2021-01-05 DIAGNOSIS — S300XXA Contusion of lower back and pelvis, initial encounter: Secondary | ICD-10-CM

## 2021-01-05 MED ORDER — CYCLOBENZAPRINE HCL 5 MG PO TABS: 5.0000 mg | ORAL_TABLET | Freq: Two times a day (BID) | ORAL | 1 refills | Status: AC | PRN

## 2021-01-05 MED ORDER — TRAMADOL HCL 50 MG PO TABS: 50.0000 mg | ORAL_TABLET | Freq: Two times a day (BID) | ORAL | 0 refills | Status: AC | PRN

## 2021-01-05 NOTE — Patient Instructions (Addendum)
It seems like you may have injured your coccyx.  This can take 6-8 weeks to resolve.   Tailbone Injury The tailbone (coccyx) is the small bone at the lower end of the spine. A tailbone injury may involve stretched ligaments, bruising, or a broken bone (fracture). Tailbone injuries can be painful, and some may take a long time to heal. What are the causes? This condition may be caused by: Falling and landing on the tailbone. Repeated strain or friction from sitting for long periods of time. This may include actions such as rowing and bicycling. Childbirth. In some cases, the cause may not be known. What are the signs or symptoms? Symptoms of this condition include: Pain in the tailbone area or lower back, especially when sitting. Pain or difficulty when standing up from a sitting position. Bruising or swelling in the tailbone area. Painful bowel movements. In women, pain during intercourse. How is this diagnosed? This condition may be diagnosed based on: Your symptoms. A physical exam. If your health care provider suspects a fracture, you may have additional tests, such as: X-rays. CT scan. MRI. How is this treated? Most tailbone injuries heal on their own in 4-6 weeks. However, recovery time may be longer if the injury involves a fracture. Treatment for this condition may include: NSAIDs or other over-the-counter medicines to help relieve your pain. Using a large, rubber or inflated ring or cushion to take pressure off the tailbone when sitting. Physical therapy. Injecting the tailbone area with local anesthesia and steroid medicine. This is not normally needed unless the pain does not improve over time with over-the-counter pain medicines. Follow these instructions at home: Activity Avoid sitting for long periods of time. To prevent repeating an injury that is caused by strain or friction: Wear appropriate padding and sports gear when bicycling and rowing. Increase your  activity as the pain allows. Perform any exercises that are recommended by your health care provider or physical therapist. Managing pain, stiffness, and swelling To help decrease discomfort when sitting: Sit on your rubber or inflated ring or cushion as told by your health care provider. Lean forward when you sit. If directed, apply ice to the injured area: Put ice in a plastic bag. Place a towel between your skin and the bag. Leave the ice on for 20 minutes, 2-3 times per day for the first 1-2 days. If directed, apply heat to the affected area as often as told by your health care provider. Use the heat source that your health care provider recommends, such as a moist heat pack or a heating pad. Place a towel between your skin and the heat source. Leave the heat on for 20-30 minutes. Remove the heat if your skin turns bright red. This is especially important if you are unable to feel pain, heat, or cold. You may have a greater risk of getting burned. General instructions Take over-the-counter and prescription medicines only as told by your health care provider. To prevent or treat constipation or painful bowel movements, your health care provider may recommend that you: Drink enough fluid to keep your urine pale yellow. Eat foods that are high in fiber, such as fresh fruits and vegetables, whole grains, and beans. Limit foods that are high in fat and processed sugars, such as fried and sweet foods. Take an over-the-counter or prescription medicine for constipation. Keep all follow-up visits as directed by your health care provider. This is important. Contact a health care provider if: Your pain becomes worse or is  not controlled with medicine. Your bowel movements cause a great deal of discomfort. You are unable to have a bowel movement after 4 days. You have pain during intercourse. Summary A tailbone injury may involve stretched ligaments, bruising, or a broken bone  (fracture). Tailbone injuries can be painful. Most heal on their own in 4-6 weeks. Treatment may include taking NSAIDs, using a rubber or inflated ring or cushion when sitting, and physical therapy. Follow any recommendations from your health care provider to prevent or treat constipation. This information is not intended to replace advice given to you by your health care provider. Make sure you discuss any questions you have with your health care provider. Document Revised: 04/24/2017 Document Reviewed: 04/24/2017 Elsevier Patient Education  2022 [employer].

## 2021-01-05 NOTE — Progress Notes (Signed)
This visit occurred during the SARS-CoV-2 public health emergency.  Safety protocols were in place, including screening questions prior to the visit, additional usage of staff PPE, and extensive cleaning of exam room while observing appropriate contact time as indicated for disinfecting solutions.    Joanne [name] , May 11, 1960, 60 y.o., female MRN: [id number] Patient Care Team    Relationship Specialty Notifications Start End  Natalia Leatherwood, DO PCP - General Family Medicine  10/28/19   Mitchel Honour, DO Consulting Physician Obstetrics and Gynecology  10/28/19   Willis Modena, MD Consulting Physician Gastroenterology  10/28/19   Judi Saa, DO Consulting Physician Sports Medicine  10/28/19   Reginia Naas, MD Referring Physician Dermatology  10/28/19     Chief Complaint  Patient presents with   Fall    Pt reports fall from 2 weeks ago and c/o LBP pt states pain has gradually worsen since fall;      Subjective: Pt presents for an OV with complaints of coccyx pain after fall about 10 days ago.  She states she was working out in the yard and stood up quickly.  Doing so she lost her balance and started to drift backwards and tripped over a curb.  She landed on her buttocks.  She states she has had some bruises and had some wrist discomfort, all of which have resolved.  However her tailbone continues to hurt and she feels it may even be getting worse.  She denies any bowel movement discomfort or dysfunction.  She has been taking Tylenol and her Mobic.  Depression screen [facility] 2/9 11/15/2020 10/28/2019  Decreased Interest 0 0  Down, Depressed, Hopeless 0 0  PHQ - 2 Score 0 0  Altered sleeping - 0  Tired, decreased energy - 0  Change in appetite - 2  Feeling bad or failure about yourself  - 0  Trouble concentrating - 0  Moving slowly or fidgety/restless - 0  Suicidal thoughts - 0  PHQ-9 Score - 2  Difficult doing work/chores - Not difficult at all    Allergies  Allergen Reactions    Penicillins Other (See Comments)    GI upset   Tape Other (See Comments)    Welts and pain   Social History   Social History Narrative   Marital status/children/pets: Married.  G2 P2   Education/employment: College degree, employed as a Solicitor:      -Wears a bicycle helmet riding a bike: Yes     -smoke alarm in the home:Yes     - wears seatbelt: Yes     - Feels safe in their relationships: Yes   Past Medical History:  Diagnosis Date   Arthritis    Diverticulitis    Endometriosis    Abdominal hysterectomy   GERD (gastroesophageal reflux disease) 06/2009   EGD with small gastric and duodenal erosions/inflammation.   Hamstring injury 2014   Right hamstring tear   Hay fever    Migraines    Past Surgical History:  Procedure Laterality Date   ABDOMINAL HYSTERECTOMY  1998   Endometriosis   APPENDECTOMY  1986   BREAST EXCISIONAL BIOPSY Right 2005   CHOLECYSTECTOMY  2010   ESOPHAGOGASTRODUODENOSCOPY  06/2009   EGD with small gastric and duodenal erosions/inflammation.   EXPLORATORY LAPAROTOMY  1988   x3 D/T endometriosis   OVARIAN CYST REMOVAL  08/2007   Family History  Problem Relation Age of Onset   Breast cancer Mother  Arthritis Mother    Hypertension Mother    Miscarriages / [place] Mother    Diabetes Mother    Arthritis Father    Heart attack Father    Diabetes Father    Heart disease Father    Early death Father    Allergies as of 01/05/2021       Reactions   Penicillins Other (See Comments)   GI upset   Tape Other (See Comments)   Welts and pain        Medication List        Accurate as of January 05, 2021  2:39 PM. If you have any questions, ask your nurse or doctor.          Adapalene-Benzoyl Peroxide 0.1-2.5 % gel Apply topically daily.   albuterol 108 (90 Base) MCG/ACT inhaler Commonly known as: VENTOLIN HFA Inhale 2 puffs into the lungs every 4 (four) hours as needed for wheezing or shortness of breath.    cetirizine 10 MG tablet Commonly known as: ZYRTEC Take 10 mg by mouth daily. Patient uses this medication for her allergies.   meloxicam 15 MG tablet Commonly known as: MOBIC Take 1 tablet (15 mg total) by mouth daily.   omeprazole 20 MG capsule Commonly known as: PRILOSEC Take 1 capsule (20 mg total) by mouth 2 (two) times daily before a meal.   promethazine 25 MG tablet Commonly known as: PHENERGAN Take 1 tablet (25 mg total) by mouth every 8 (eight) hours as needed for nausea or vomiting.   spironolactone 25 MG tablet Commonly known as: ALDACTONE Take 25 mg by mouth in the morning and at bedtime.        All past medical history, surgical history, allergies, family history, immunizations andmedications were updated in the EMR today and reviewed under the history and medication portions of their EMR.     ROS: Negative, with the exception of above mentioned in HPI   Objective:  BP 104/74   Pulse 83   Temp (!) 97.4 F (36.3 C) (Oral)   Ht 5\' 9"  (1.753 m)   Wt 258 lb (117 kg)   SpO2 100%   BMI 38.10 kg/m  Body mass index is 38.1 kg/m. Gen: Afebrile. No acute distress. Nontoxic in appearance, well developed, well nourished.  HENT: AT. [place].  Eyes:Pupils Equal Round Reactive to light, Extraocular movements intact,  Conjunctiva without redness, discharge or icterus. MSK: Sitting in chair leaning to left side, appears uncomfortable.  Mild tenderness to palpation over left sacral area.  Coccyx exquisitely tender and bruised. Neuro: Normal gait. PERLA. EOMi. Alert. Oriented x3  Psych: Normal affect, dress and demeanor. Normal speech. Normal thought content and judgment.  No results found. No results found. No results found for this or any previous visit (from the past 24 hour(s)).  Assessment/Plan: BONI MACLELLAN is a 60 y.o. female present for OV for  Contusion of coccyx, initial encounter May continue Mobic.  May continue Tylenol. Short course of tramadol as  needed. Flexeril 1-2 tabs up to twice daily as needed.  Caution on sedation was provided. Patient was informed contusion or small fracture can take 6 to 8 weeks to heal fully.  She was encouraged to buy a "coccyx cushion.  "To offset pressure to the area when seated. Elected to not x-ray at this time since she is able to have bowel movements without discomfort. Follow-up as needed  Fall, initial encounter Patient has had 2 mechanical falls within short timeframe.  Both were  with cause.  She does not believe she feels unstable or has had any gait changes.  I have asked her to monitor this more closely and if she is feeling unstable on her feet or having gait changes would want to get her evaluated more thoroughly with neurology.  Patient reports understanding.  Reviewed expectations re: course of current medical issues. Discussed self-management of symptoms. Outlined signs and symptoms indicating need for more acute intervention. Patient verbalized understanding and all questions were answered. Patient received an After-Visit Summary.    No orders of the defined types were placed in this encounter.  No orders of the defined types were placed in this encounter.  Referral Orders  No referral(s) requested today     Note is dictated utilizing voice recognition software. Although note has been proof read prior to signing, occasional typographical errors still can be missed. If any questions arise, please do not hesitate to call for verification.   electronically signed by:  Felix Pacini, DO  [facility] - [place]

## 2021-01-27 ENCOUNTER — Other Ambulatory Visit: Payer: Self-pay

## 2021-01-27 ENCOUNTER — Encounter: Payer: Self-pay | Admitting: Family Medicine

## 2021-01-27 ENCOUNTER — Ambulatory Visit: Payer: BC Managed Care – PPO | Admitting: Family Medicine

## 2021-01-27 VITALS — BP 97/68 | HR 80 | Temp 97.8°F | Resp 16 | Ht 69.0 in | Wt 250.8 lb

## 2021-01-27 DIAGNOSIS — T50905A Adverse effect of unspecified drugs, medicaments and biological substances, initial encounter: Secondary | ICD-10-CM | POA: Diagnosis not present

## 2021-01-27 DIAGNOSIS — R5383 Other fatigue: Secondary | ICD-10-CM | POA: Diagnosis not present

## 2021-01-27 DIAGNOSIS — L03113 Cellulitis of right upper limb: Secondary | ICD-10-CM | POA: Diagnosis not present

## 2021-01-27 DIAGNOSIS — R229 Localized swelling, mass and lump, unspecified: Secondary | ICD-10-CM | POA: Diagnosis not present

## 2021-01-27 NOTE — Progress Notes (Signed)
OFFICE VISIT  01/27/2021  CC:  Chief Complaint  Patient presents with   Cellulitis    Last week, occurred on L arm and this time it is on R forearm.   HPI:    Patient is a 60 y.o. female who presents for recent L arm infection.  HPI: Patient says this all started about 6 days ago when she noted some subcutaneous nodules come up on the proximal, posterior aspect of her right forearm. These hurt some and then she noted gradual onset of redness in the area--not streaky.  More generalized/splotchy.  There were no surface lesions such as pustules or vesicles or papules.  Says the swelling and redness was extending down the forearm as well as involving the elbow and few inches above the elbow.  Says right elbow felt swollen.  She did not have fever, chills, or malaise. She went to an urgent care. Dx'd with R arm cellulitis, placed on sulfa abx. The symptoms and arm appearance did not change so she returned to the urgent care, Keflex was added and she was told to go to the ER if it continued to worsen.  She then went to 01/23/21 [facility] ED d/t redness extending beyond pen demarcation and swelling continued->cbc and cmet normal, pt continued on keflex and sulfa. Since then her right arm has returned to normal with the exception of the small subcutaneous bumps--these remain, or nontender, and have no overlying skin changes.  He notes significant onset of fatigue over the last week.  She is not sure if this is from the antibiotics or something else.  She did not have fatigue preceding or onset with her right arm pain and redness. No history of similar type illness.  ROS as above, plus--> nno CP, no SOB, no wheezing, no cough, no dizziness, no HAs, no rashes, no melena/hematochezia.  No polyuria or polydipsia.  No myalgias or arthralgias other than R elbow recently.  No focal weakness, paresthesias, or tremors.  No acute vision or hearing abnormalities.  No dysuria or unusual/new urinary urgency or  frequency.  No recent changes in lower legs. No n/v/d or abd pain.  No palpitations.    Past Medical History:  Diagnosis Date   Arthritis    Diverticulitis    Endometriosis    Abdominal hysterectomy   GERD (gastroesophageal reflux disease) 06/2009   EGD with small gastric and duodenal erosions/inflammation.   Hamstring injury 2014   Right hamstring tear   Hay fever    Migraines     Past Surgical History:  Procedure Laterality Date   ABDOMINAL HYSTERECTOMY  1998   Endometriosis   APPENDECTOMY  1986   BREAST EXCISIONAL BIOPSY Right 2005   CHOLECYSTECTOMY  2010   ESOPHAGOGASTRODUODENOSCOPY  06/2009   EGD with small gastric and duodenal erosions/inflammation.   EXPLORATORY LAPAROTOMY  1988   x3 D/T endometriosis   OVARIAN CYST REMOVAL  08/2007    Outpatient Medications Prior to Visit  Medication Sig Dispense Refill   Adapalene-Benzoyl Peroxide 0.1-2.5 % gel Apply topically daily.     cephALEXin (KEFLEX) 500 MG capsule Take 500 mg by mouth 4 (four) times daily.     meloxicam (MOBIC) 15 MG tablet Take 1 tablet (15 mg total) by mouth daily. 90 tablet 3   omeprazole (PRILOSEC) 20 MG capsule Take 1 capsule (20 mg total) by mouth 2 (two) times daily before a meal. 180 capsule 3   promethazine (PHENERGAN) 25 MG tablet Take 1 tablet (25 mg total) by mouth  every 8 (eight) hours as needed for nausea or vomiting. 20 tablet 0   spironolactone (ALDACTONE) 25 MG tablet Take 25 mg by mouth in the morning and at bedtime.     sulfamethoxazole-trimethoprim (BACTRIM DS) 800-160 MG tablet Take 1 tablet by mouth 2 (two) times daily.     albuterol (PROVENTIL HFA;VENTOLIN HFA) 108 (90 Base) MCG/ACT inhaler Inhale 2 puffs into the lungs every 4 (four) hours as needed for wheezing or shortness of breath. (Patient not taking: Reported on 01/27/2021) 1 Inhaler 0   cetirizine (ZYRTEC) 10 MG tablet Take 10 mg by mouth daily. Patient uses this medication for her allergies. (Patient not taking: Reported on  01/27/2021)     cyclobenzaprine (FLEXERIL) 5 MG tablet Take 1-2 tablets (5-10 mg total) by mouth 2 (two) times daily as needed for muscle spasms. (Patient not taking: Reported on 01/27/2021) 90 tablet 1   Facility-Administered Medications Prior to Visit  Medication Dose Route Frequency Provider Last Rate Last Admin   gadobutrol (GADAVIST) 1 MMOL/ML injection 10 mL  10 mL Intravenous Once PRN Delford Field, Kristen M, PA-C        Allergies  Allergen Reactions   Penicillins Other (See Comments)    GI upset   Tape Other (See Comments)    Welts and pain    ROS As per HPI  PE: Vitals with BMI 01/27/2021 01/05/2021 12/03/2020  Height 5\' 9"  5\' 9"  5\' 9"   Weight 250 lbs 13 oz 258 lbs 269 lbs  BMI 37.02 38.08 39.71  Systolic 97 104 114  Diastolic 68 74 75  Pulse 80 83 73   Gen: Alert, well appearing.  Patient is oriented to person, place, time, and situation. AFFECT: pleasant, lucid thought and speech. R elbow and forearm without erythema or swelling or tenderness. There are 3-4 subQ nodules in R forearm posterolateral aspect, nontender, mobile.  Rubbery. No fluctuance or induration.  These are 1-2 cm in size.  LABS:    Chemistry      Component Value Date/Time   NA 140 11/15/2020 0900   K 4.2 11/15/2020 0900   CL 103 11/15/2020 0900   CO2 27 11/15/2020 0900   BUN 17 11/15/2020 0900   CREATININE 0.85 11/15/2020 0900      Component Value Date/Time   CALCIUM 9.6 11/15/2020 0900   ALKPHOS 72 11/15/2020 0900   AST 21 11/15/2020 0900   ALT 25 11/15/2020 0900   BILITOT 0.5 11/15/2020 0900     Lab Results  Component Value Date   WBC 3.7 (L) 11/15/2020   HGB 12.9 11/15/2020   HCT 38.9 11/15/2020   MCV 92.6 11/15/2020   PLT 227.0 11/15/2020   Lab Results  Component Value Date   HGBA1C 5.1 11/15/2020   IMPRESSION AND PLAN:  1) R arm cellulitis: resolved with bactrim and keflex.   Suspect her R elbow swelling was soft tissue swelling rather than joint effusion. Suspect her  fatigue is secondary to being on bactrim and keflex---we'll see how this goes after finishing these meds. Regarding the subQ nodules in forearm I'm perplexed. On bedside u/s today these were anechoic, w/out calcification.  There was one with a thin septum--consistent with benign/cystic appearance.  Obs.  Spent 31 min with pt today reviewing HPI, reviewing relevant past history, doing exam, reviewing and discussing lab and imaging data, and formulating plans.  An After Visit Summary was printed and given to the patient.  FOLLOW UP: Return if symptoms worsen or fail to improve.  Signed:  Santiago Bumpers, MD           01/27/2021

## 2021-02-02 ENCOUNTER — Telehealth: Payer: Self-pay

## 2021-02-02 NOTE — Telephone Encounter (Signed)
When I saw her the little bumps were there but they are not anything that I expect to resolve with antibiotics. I feel that they are benign and would only be concerned if she started getting redness or swelling of arm again.

## 2021-02-02 NOTE — Telephone Encounter (Signed)
LM for pt to returncall

## 2021-02-02 NOTE — Telephone Encounter (Signed)
Please advise when pt needs to follow up within office.

## 2021-02-02 NOTE — Telephone Encounter (Signed)
Pt advised of recommendations.  

## 2021-02-02 NOTE — Telephone Encounter (Signed)
Pt was seen in our office for bumps on her arm on 01/27/21 with Dr. Milinda Cave. States she is on the last day of abx and bumps are not getting better. I offered to schedule appt  for pt and she declined. She does not feel she should have to come back in because provider knows what is going on. Pt is unsure if she needs more abx or what she needs to do now.   Please advise, thanks.

## 2021-02-14 ENCOUNTER — Other Ambulatory Visit: Payer: Self-pay | Admitting: Family Medicine

## 2021-04-08 ENCOUNTER — Other Ambulatory Visit: Payer: Self-pay

## 2021-04-08 ENCOUNTER — Encounter: Payer: Self-pay | Admitting: Family Medicine

## 2021-04-08 ENCOUNTER — Ambulatory Visit: Payer: BC Managed Care – PPO | Admitting: Family Medicine

## 2021-04-08 VITALS — BP 98/66 | HR 63 | Temp 97.3°F | Ht 69.0 in | Wt 250.0 lb

## 2021-04-08 DIAGNOSIS — R051 Acute cough: Secondary | ICD-10-CM | POA: Diagnosis not present

## 2021-04-08 DIAGNOSIS — J209 Acute bronchitis, unspecified: Secondary | ICD-10-CM | POA: Diagnosis not present

## 2021-04-08 DIAGNOSIS — J029 Acute pharyngitis, unspecified: Secondary | ICD-10-CM

## 2021-04-08 LAB — POCT RAPID STREP A (OFFICE): Rapid Strep A Screen: NEGATIVE

## 2021-04-08 LAB — POCT INFLUENZA A/B
Influenza A, POC: NEGATIVE
Influenza B, POC: NEGATIVE

## 2021-04-08 LAB — POC COVID19 BINAXNOW: SARS Coronavirus 2 Ag: NEGATIVE

## 2021-04-08 MED ORDER — BENZONATATE 200 MG PO CAPS: 200.0000 mg | ORAL_CAPSULE | Freq: Two times a day (BID) | ORAL | 0 refills | Status: AC | PRN

## 2021-04-08 MED ORDER — DOXYCYCLINE HYCLATE 100 MG PO TABS
100.0000 mg | ORAL_TABLET | Freq: Two times a day (BID) | ORAL | 0 refills | Status: AC
Start: 2021-04-08 — End: ?

## 2021-04-08 MED ORDER — HYDROCODONE BIT-HOMATROP MBR 5-1.5 MG/5ML PO SOLN: 5.0000 mL | Freq: Three times a day (TID) | ORAL | 0 refills | Status: DC | PRN

## 2021-04-08 MED ORDER — FLUTICASONE PROPIONATE 50 MCG/ACT NA SUSP: 2.0000 | Freq: Every day | NASAL | 6 refills | Status: AC

## 2021-04-08 NOTE — Patient Instructions (Signed)
Acute Bronchitis, Adult ?Acute bronchitis is when air tubes in the lungs (bronchi) suddenly get swollen. The condition can make it hard for you to breathe. In adults, acute bronchitis usually goes away within 2 weeks. A cough caused by bronchitis may last up to 3 weeks. Smoking, allergies, and asthma can make the condition worse. ?What are the causes? ?Germs that cause cold and flu (viruses). The most common cause of this condition is the virus that causes the common cold. ?Bacteria. ?Substances that bother (irritate) the lungs, including: ?Smoke from cigarettes and other types of tobacco. ?Dust and pollen. ?Fumes from chemicals, gases, or burned fuel. ?Indoor or outdoor air pollution. ?What increases the risk? ?A weak body's defense system. This is also called the immune system. ?Any condition that affects your lungs and breathing, such as asthma. ?What are the signs or symptoms? ?A cough. ?Coughing up clear, yellow, or green mucus. ?Making high-pitched whistling sounds when you breathe, most often when you breathe out (wheezing). ?Runny or stuffy nose. ?Having too much mucus in your lungs (chest congestion). ?Shortness of breath. ?Body aches. ?A sore throat. ?How is this treated? ?Acute bronchitis may go away over time without treatment. Your doctor may tell you to: ?Drink more fluids. This will help thin your mucus so it is easier to cough up. ?Use a device that gets medicine into your lungs (inhaler). ?Use a vaporizer or a humidifier. These are machines that add water to the air. This helps with coughing and poor breathing. ?Take a medicine that thins mucus and helps clear it from your lungs. ?Take a medicine that prevents or stops coughing. ?It is not common to take an antibiotic medicine for this condition. ?Follow these instructions at home: ? ?Take over-the-counter and prescription medicines only as told by your doctor. ?Use an inhaler, vaporizer, or humidifier as told by your doctor. ?Take two teaspoons (10  mL) of honey at bedtime. This helps lessen your coughing at night. ?Drink enough fluid to keep your pee (urine) pale yellow. ?Do not smoke or use any products that contain nicotine or tobacco. If you need help quitting, ask your doctor. ?Get a lot of rest. ?Return to your normal activities when your doctor says that it is safe. ?Keep all follow-up visits. ?How is this prevented? ? ?Wash your hands often with soap and water for at least 20 seconds. If you cannot use soap and water, use hand sanitizer. ?Avoid contact with people who have cold symptoms. ?Try not to touch your mouth, nose, or eyes with your hands. ?Avoid breathing in smoke or chemical fumes. ?Make sure to get the flu shot every year. ?Contact a doctor if: ?Your symptoms do not get better in 2 weeks. ?You have trouble coughing up the mucus. ?Your cough keeps you awake at night. ?You have a fever. ?Get help right away if: ?You cough up blood. ?You have chest pain. ?You have very bad shortness of breath. ?You faint or keep feeling like you are going to faint. ?You have a very bad headache. ?Your fever or chills get worse. ?These symptoms may be an emergency. Get help right away. Call your local emergency services (911 in the [place] ?Do not wait to see if the symptoms will go away. ?Do not drive yourself to the hospital. ?Summary ?Acute bronchitis is when air tubes in the lungs (bronchi) suddenly get swollen. In adults, acute bronchitis usually goes away within 2 weeks. ?Drink more fluids. This will help thin your mucus so it is easier to   cough up. ?Take over-the-counter and prescription medicines only as told by your doctor. ?Contact a doctor if your symptoms do not improve after 2 weeks of treatment. ?This information is not intended to replace advice given to you by your health care provider. Make sure you discuss any questions you have with your health care provider. ?Document Revised: 07/28/2020 Document Reviewed: 07/28/2020 ?Elsevier Patient Education  ? 2022 [employer] ? ?

## 2021-04-08 NOTE — Progress Notes (Signed)
This visit occurred during the SARS-CoV-2 public health emergency.  Safety protocols were in place, including screening questions prior to the visit, additional usage of staff PPE, and extensive cleaning of exam room while observing appropriate contact time as indicated for disinfecting solutions.    Joanne [name] , 11-01-1960, 60 y.o., female MRN: [id number] Patient Care Team    Relationship Specialty Notifications Start End  Natalia Leatherwood, DO PCP - General Family Medicine  10/28/19   Mitchel Honour, DO Consulting Physician Obstetrics and Gynecology  10/28/19   Willis Modena, MD Consulting Physician Gastroenterology  10/28/19   Judi Saa, DO Consulting Physician Sports Medicine  10/28/19   Reginia Naas, MD Referring Physician Dermatology  10/28/19     Chief Complaint  Patient presents with   Cough    Pt c/o productive cough, sore throat, eye drainage, nasal drainage x 10 days     Subjective: Pt presents for an OV with complaints of acute illness of 10 days duration.  She reports her symptoms originally started with a fever and cough, along with a sore throat and then progressed.  She has been taking DayQuil and NyQuil, along with Mucinex.  She feels her symptoms are worsening and her cough is now starting to become productive.  The cough is keeping her up at night and is causing her chest discomfort and rib pain with cough.  Depression screen [facility] 2/9 01/27/2021 11/15/2020 10/28/2019  Decreased Interest 0 0 0  Down, Depressed, Hopeless 0 0 0  PHQ - 2 Score 0 0 0  Altered sleeping - - 0  Tired, decreased energy - - 0  Change in appetite - - 2  Feeling bad or failure about yourself  - - 0  Trouble concentrating - - 0  Moving slowly or fidgety/restless - - 0  Suicidal thoughts - - 0  PHQ-9 Score - - 2  Difficult doing work/chores - - Not difficult at all    Allergies  Allergen Reactions   Penicillins Other (See Comments)    GI upset   Tape Other (See Comments)    Welts  and pain   Social History   Social History Narrative   Marital status/children/pets: Married.  G2 P2   Education/employment: College degree, employed as a Solicitor:      -Wears a bicycle helmet riding a bike: Yes     -smoke alarm in the home:Yes     - wears seatbelt: Yes     - Feels safe in their relationships: Yes   Past Medical History:  Diagnosis Date   Arthritis    Diverticulitis    Endometriosis    Abdominal hysterectomy   GERD (gastroesophageal reflux disease) 06/2009   EGD with small gastric and duodenal erosions/inflammation.   Hamstring injury 2014   Right hamstring tear   Hay fever    Migraines    Past Surgical History:  Procedure Laterality Date   ABDOMINAL HYSTERECTOMY  1998   Endometriosis   APPENDECTOMY  1986   BREAST EXCISIONAL BIOPSY Right 2005   CHOLECYSTECTOMY  2010   ESOPHAGOGASTRODUODENOSCOPY  06/2009   EGD with small gastric and duodenal erosions/inflammation.   EXPLORATORY LAPAROTOMY  1988   x3 D/T endometriosis   OVARIAN CYST REMOVAL  08/2007   Family History  Problem Relation Age of Onset   Breast cancer Mother    Arthritis Mother    Hypertension Mother    Miscarriages / [place] Mother  Diabetes Mother    Arthritis Father    Heart attack Father    Diabetes Father    Heart disease Father    Early death Father    Allergies as of 04/08/2021       Reactions   Penicillins Other (See Comments)   GI upset   Tape Other (See Comments)   Welts and pain        Medication List        Accurate as of April 08, 2021  5:00 PM. If you have any questions, ask your nurse or doctor.          STOP taking these medications    cephALEXin 500 MG capsule Commonly known as: KEFLEX Stopped by: Felix Pacini, DO   sulfamethoxazole-trimethoprim 800-160 MG tablet Commonly known as: BACTRIM DS Stopped by: Felix Pacini, DO       TAKE these medications    Adapalene-Benzoyl Peroxide 0.1-2.5 % gel Apply topically  daily.   albuterol 108 (90 Base) MCG/ACT inhaler Commonly known as: VENTOLIN HFA Inhale 2 puffs into the lungs every 4 (four) hours as needed for wheezing or shortness of breath.   benzonatate 200 MG capsule Commonly known as: TESSALON Take 1 capsule (200 mg total) by mouth 2 (two) times daily as needed for cough. Started by: Felix Pacini, DO   cetirizine 10 MG tablet Commonly known as: ZYRTEC Take 10 mg by mouth daily. Patient uses this medication for her allergies.   cyclobenzaprine 5 MG tablet Commonly known as: FLEXERIL Take 1-2 tablets (5-10 mg total) by mouth 2 (two) times daily as needed for muscle spasms.   doxycycline 100 MG tablet Commonly known as: VIBRA-TABS Take 1 tablet (100 mg total) by mouth 2 (two) times daily. Started by: Felix Pacini, DO   fluticasone 50 MCG/ACT nasal spray Commonly known as: FLONASE Place 2 sprays into both nostrils daily. Started by: Felix Pacini, DO   HYDROcodone bit-homatropine 5-1.5 MG/5ML syrup Commonly known as: HYCODAN Take 5 mLs by mouth every 8 (eight) hours as needed for cough. Started by: Felix Pacini, DO   meloxicam 15 MG tablet Commonly known as: MOBIC Take 1 tablet (15 mg total) by mouth daily.   omeprazole 20 MG capsule Commonly known as: PRILOSEC Take 1 capsule (20 mg total) by mouth 2 (two) times daily before a meal.   promethazine 25 MG tablet Commonly known as: PHENERGAN Take 1 tablet (25 mg total) by mouth every 8 (eight) hours as needed for nausea or vomiting.   spironolactone 25 MG tablet Commonly known as: ALDACTONE Take 25 mg by mouth in the morning and at bedtime.        All past medical history, surgical history, allergies, family history, immunizations andmedications were updated in the EMR today and reviewed under the history and medication portions of their EMR.     Review of Systems  Constitutional:  Positive for fever and malaise/fatigue. Negative for chills.  HENT:  Positive for congestion,  ear pain, sinus pain and sore throat. Negative for ear discharge and nosebleeds.   Eyes:  Positive for discharge. Negative for pain and redness.  Respiratory:  Positive for cough and sputum production.   Cardiovascular:  Negative for chest pain.  Gastrointestinal:  Positive for nausea. Negative for abdominal pain, blood in stool, constipation, diarrhea and vomiting.  Skin:  Negative for rash.  Neurological:  Positive for headaches. Negative for weakness.  Negative, with the exception of above mentioned in HPI   Objective:  BP 98/66  Pulse 63    Temp (!) 97.3 F (36.3 C) (Oral)    Ht 5\' 9"  (1.753 m)    Wt 250 lb (113.4 kg)    SpO2 96%    BMI 36.92 kg/m  Body mass index is 36.92 kg/m.  Physical Exam Vitals and nursing note reviewed.  Constitutional:      General: She is not in acute distress.    Appearance: Normal appearance. She is not ill-appearing, toxic-appearing or diaphoretic.  HENT:     Head: Normocephalic and atraumatic.     Right Ear: Tympanic membrane, ear canal and external ear normal.     Left Ear: Tympanic membrane, ear canal and external ear normal.     Nose: Congestion and rhinorrhea present.     Mouth/Throat:     Mouth: Mucous membranes are moist.     Pharynx: No oropharyngeal exudate or posterior oropharyngeal erythema.  Eyes:     General: No scleral icterus.       Right eye: No discharge.        Left eye: No discharge.     Extraocular Movements: Extraocular movements intact.     Conjunctiva/sclera: Conjunctivae normal.     Pupils: Pupils are equal, round, and reactive to light.  Cardiovascular:     Rate and Rhythm: Normal rate and regular rhythm.     Heart sounds: No murmur heard.   No friction rub. No gallop.  Pulmonary:     Effort: Pulmonary effort is normal. No respiratory distress.     Breath sounds: Rhonchi present. No wheezing or rales.  Musculoskeletal:     Cervical back: Neck supple. No tenderness.     Right lower leg: No edema.     Left lower  leg: No edema.  Lymphadenopathy:     Cervical: No cervical adenopathy.  Skin:    General: Skin is warm and dry.     Coloration: Skin is not jaundiced or pale.     Findings: No erythema or rash.  Neurological:     Mental Status: She is alert and oriented to person, place, and time. Mental status is at baseline.     Motor: No weakness.     Gait: Gait normal.  Psychiatric:        Mood and Affect: Mood normal.        Behavior: Behavior normal.        Thought Content: Thought content normal.        Judgment: Judgment normal.     No results found. No results found. Results for orders placed or performed in visit on 04/08/21 (from the past 24 hour(s))  POC COVID-19 BinaxNow     Status: Normal   Collection Time: 04/08/21  4:36 PM  Result Value Ref Range   SARS Coronavirus 2 Ag Negative Negative  POCT rapid strep A     Status: Normal   Collection Time: 04/08/21  4:37 PM  Result Value Ref Range   Rapid Strep A Screen Negative Negative  POCT Influenza A/B     Status: Normal   Collection Time: 04/08/21  4:54 PM  Result Value Ref Range   Influenza A, POC Negative Negative   Influenza B, POC Negative Negative    Assessment/Plan: EMBRIE MIKKELSEN is a 60 y.o. female present for OV for  Cough/sore throat/bronchitis greater than 10 days - POC COVID-19 BinaxNow - POCT Influenza A/B - POCT rapid strep  Rest, hydrate.  flonase, mucinex (DM if cough), nettie pot or nasal saline.  Doxy bid prescribed, take until completed.  Tessalon perles and hycodan for cough  If cough present it can last up to 6-8 weeks.  F/U 2 weeks of not improved.   Reviewed expectations re: course of current medical issues. Discussed self-management of symptoms. Outlined signs and symptoms indicating need for more acute intervention. Patient verbalized understanding and all questions were answered. Patient received an After-Visit Summary.    Orders Placed This Encounter  Procedures   POC COVID-19 BinaxNow    POCT Influenza A/B   POCT rapid strep A   Meds ordered this encounter  Medications   doxycycline (VIBRA-TABS) 100 MG tablet    Sig: Take 1 tablet (100 mg total) by mouth 2 (two) times daily.    Dispense:  20 tablet    Refill:  0   benzonatate (TESSALON) 200 MG capsule    Sig: Take 1 capsule (200 mg total) by mouth 2 (two) times daily as needed for cough.    Dispense:  20 capsule    Refill:  0   fluticasone (FLONASE) 50 MCG/ACT nasal spray    Sig: Place 2 sprays into both nostrils daily.    Dispense:  16 g    Refill:  6   HYDROcodone bit-homatropine (HYCODAN) 5-1.5 MG/5ML syrup    Sig: Take 5 mLs by mouth every 8 (eight) hours as needed for cough.    Dispense:  120 mL    Refill:  0   Referral Orders  No referral(s) requested today     Note is dictated utilizing voice recognition software. Although note has been proof read prior to signing, occasional typographical errors still can be missed. If any questions arise, please do not hesitate to call for verification.   electronically signed by:  Felix Pacini, DO  [facility] [place]

## 2021-04-15 ENCOUNTER — Telehealth: Payer: Self-pay | Admitting: Family Medicine

## 2021-04-15 NOTE — Telephone Encounter (Signed)
Pt called needing a prescription on.--KR  benzonatate benzonatate (TESSALON) 200 MG capsule  HYDROcodone bit-homatropine HYDROcodone bit-homatropine (HYCODAN) 5-1.5 MG/5ML syrup   [facility] [place], [place] - [address] Phone:  [phone]  Fax:  [phone]

## 2021-04-16 ENCOUNTER — Other Ambulatory Visit: Payer: Self-pay | Admitting: Family Medicine

## 2021-04-18 NOTE — Telephone Encounter (Signed)
Received voicemail from patient; she is returning our call  Please try to contact patient again [phone]

## 2021-04-18 NOTE — Telephone Encounter (Signed)
LVM for pt to CB regarding medication refill.   Note: Refill not appropriate. Per Dr. Alan Ripper last note on 12/30: If cough present it can last up to 6-8 weeks. F/U 2 weeks of not improved.

## 2021-04-18 NOTE — Telephone Encounter (Signed)
Please declined °

## 2021-04-19 NOTE — Telephone Encounter (Signed)
Spoke with pt and informed her that refill not appropriate without appt. Pt states cough and sx has improved but would like refill. Pt declined appt and stated she will take OTC meds.

## 2021-04-30 ENCOUNTER — Telehealth: Payer: BC Managed Care – PPO | Admitting: Nurse Practitioner

## 2021-04-30 DIAGNOSIS — J40 Bronchitis, not specified as acute or chronic: Secondary | ICD-10-CM | POA: Diagnosis not present

## 2021-04-30 MED ORDER — BENZONATATE 100 MG PO CAPS: 100.0000 mg | ORAL_CAPSULE | Freq: Three times a day (TID) | ORAL | 0 refills | Status: AC | PRN

## 2021-04-30 MED ORDER — AZITHROMYCIN 250 MG PO TABS: ORAL_TABLET | ORAL | 0 refills | Status: AC

## 2021-04-30 MED ORDER — ALBUTEROL SULFATE HFA 108 (90 BASE) MCG/ACT IN AERS: 2.0000 | INHALATION_SPRAY | Freq: Four times a day (QID) | RESPIRATORY_TRACT | 0 refills | Status: AC | PRN

## 2021-04-30 NOTE — Progress Notes (Signed)
Virtual Visit Consent   [name], you are scheduled for a virtual visit with a [facility] provider today.     Just as with appointments in the office, your consent must be obtained to participate.  Your consent will be active for this visit and any virtual visit you may have with one of our providers in the next 365 days.     If you have a MyChart account, a copy of this consent can be sent to you electronically.  All virtual visits are billed to your insurance company just like a traditional visit in the office.    As this is a virtual visit, video technology does not allow for your provider to perform a traditional examination.  This may limit your provider's ability to fully assess your condition.  If your provider identifies any concerns that need to be evaluated in person or the need to arrange testing (such as labs, EKG, etc.), we will make arrangements to do so.     Although advances in technology are sophisticated, we cannot ensure that it will always work on either your end or our end.  If the connection with a video visit is poor, the visit may have to be switched to a telephone visit.  With either a video or telephone visit, we are not always able to ensure that we have a secure connection.     I need to obtain your verbal consent now.   Are you willing to proceed with your visit today?    Joanne [name] has provided verbal consent on 04/30/2021 for a virtual visit (video or telephone).   [name], FNP   Date: 04/30/2021 8:57 AM   Virtual Visit via Video Note   I, [name], connected with  Joanne [name]  ([id number], Nov 07, 1960) on 04/30/21 at  9:00 AM EST by a video-enabled telemedicine application and verified that I am speaking with the correct person using two identifiers.  Location: Patient: Virtual Visit Location Patient: Home Provider: Virtual Visit Location Provider: Home Office   I discussed the limitations of evaluation and management by telemedicine and  the availability of in person appointments. The patient expressed understanding and agreed to proceed.    History of Present Illness: Joanne [name] is a 61 y.o. who identifies as a female who was assigned female at birth, and is being seen today for URI symptoms that have been on and off since 03/29/21. She has been tested several times for COVID including today and has been negative.   She was seen 04/08/21 by her PCP and given Doxycycline, Flonase nasal spray, tessalon pearls, and zyrtec.   She has continued to cough.  Denies nasal congestion.   Starting to loose her voice  Cough continues to be productive.   Has drainage from her eyes today.   Has not used an inhaler since being sick this time, but has needed to use one in the past.   She is starting to have some discomfort with coughing.  Her cough is very deep and once she starts she cannot stop.   Antibiotic stopped January 8th   Has taken prednisone in the past but they make her very sleepy.   Problems:  Patient Active Problem List   Diagnosis Date Noted   B12 deficiency 11/15/2020   Encounter for long-term current use of medication 11/15/2020   Family history of heart disease 11/15/2020   Encounter for monitoring long-term proton pump inhibitor therapy 11/13/2019   Arthralgia  11/13/2019   Gastroesophageal reflux disease without esophagitis 10/28/2019   Obesity (BMI 30-39.9) 10/28/2019   Piriformis syndrome of right side 05/29/2019    Allergies:  Allergies  Allergen Reactions   Penicillins Other (See Comments)    GI upset   Tape Other (See Comments)    Welts and pain   Medications:  Current Outpatient Medications:    Adapalene-Benzoyl Peroxide 0.1-2.5 % gel, Apply topically daily., Disp: , Rfl:    albuterol (PROVENTIL HFA;VENTOLIN HFA) 108 (90 Base) MCG/ACT inhaler, Inhale 2 puffs into the lungs every 4 (four) hours as needed for wheezing or shortness of breath., Disp: 1 Inhaler, Rfl: 0   benzonatate (TESSALON)  200 MG capsule, Take 1 capsule (200 mg total) by mouth 2 (two) times daily as needed for cough., Disp: 20 capsule, Rfl: 0   cetirizine (ZYRTEC) 10 MG tablet, Take 10 mg by mouth daily. Patient uses this medication for her allergies., Disp: , Rfl:    cyclobenzaprine (FLEXERIL) 5 MG tablet, Take 1-2 tablets (5-10 mg total) by mouth 2 (two) times daily as needed for muscle spasms., Disp: 90 tablet, Rfl: 1   doxycycline (VIBRA-TABS) 100 MG tablet, Take 1 tablet (100 mg total) by mouth 2 (two) times daily., Disp: 20 tablet, Rfl: 0   fluticasone (FLONASE) 50 MCG/ACT nasal spray, Place 2 sprays into both nostrils daily., Disp: 16 g, Rfl: 6   HYDROcodone bit-homatropine (HYCODAN) 5-1.5 MG/5ML syrup, Take 5 mLs by mouth every 8 (eight) hours as needed for cough., Disp: 120 mL, Rfl: 0   meloxicam (MOBIC) 15 MG tablet, Take 1 tablet (15 mg total) by mouth daily., Disp: 90 tablet, Rfl: 3   omeprazole (PRILOSEC) 20 MG capsule, Take 1 capsule (20 mg total) by mouth 2 (two) times daily before a meal., Disp: 180 capsule, Rfl: 3   promethazine (PHENERGAN) 25 MG tablet, Take 1 tablet (25 mg total) by mouth every 8 (eight) hours as needed for nausea or vomiting., Disp: 20 tablet, Rfl: 0   spironolactone (ALDACTONE) 25 MG tablet, Take 25 mg by mouth in the morning and at bedtime., Disp: , Rfl:  No current facility-administered medications for this visit.  Facility-Administered Medications Ordered in Other Visits:    gadobutrol (GADAVIST) 1 MMOL/ML injection 10 mL, 10 mL, Intravenous, Once PRN, Joya Gaskins, Kristen M, PA-C  Observations/Objective: Patient is well-developed, well-nourished in no acute distress.  Resting comfortably at home.  Head is normocephalic, atraumatic.  No labored breathing.  Speech is clear and coherent with logical content.  Patient is alert and oriented at baseline.    Assessment and Plan: 1. Bronchitis  - azithromycin (ZITHROMAX) 250 MG tablet; Take 2 tablets on day 1, then 1 tablet daily  on days 2 through 5  Dispense: 6 tablet; Refill: 0 - albuterol (VENTOLIN HFA) 108 (90 Base) MCG/ACT inhaler; Inhale 2 puffs into the lungs every 6 (six) hours as needed for wheezing or shortness of breath.  Dispense: 8 g; Refill: 0 - benzonatate (TESSALON) 100 MG capsule; Take 1 capsule (100 mg total) by mouth 3 (three) times daily as needed for cough.  Dispense: 30 capsule; Refill: 0    May use Mucinex to assist with mucous production, increase water.  Humidifier by bed Lozenges with honey for comfort.   If no improvement in 72 hours please have follow up with [facility] or PCP Follow Up Instructions: I discussed the assessment and treatment plan with the patient. The patient was provided an opportunity to ask questions and all were answered. The  patient agreed with the plan and demonstrated an understanding of the instructions.  A copy of instructions were sent to the patient via MyChart unless otherwise noted below.    The patient was advised to call back or seek an in-person evaluation if the symptoms worsen or if the condition fails to improve as anticipated.  Time:  I spent 15 minutes with the patient via telehealth technology discussing the above problems/concerns.    [name], FNP

## 2021-08-19 ENCOUNTER — Other Ambulatory Visit: Payer: Self-pay | Admitting: Obstetrics & Gynecology

## 2021-08-19 DIAGNOSIS — Z1231 Encounter for screening mammogram for malignant neoplasm of breast: Secondary | ICD-10-CM

## 2021-08-23 ENCOUNTER — Other Ambulatory Visit: Payer: Self-pay | Admitting: Family Medicine

## 2021-08-23 DIAGNOSIS — M2550 Pain in unspecified joint: Secondary | ICD-10-CM

## 2021-09-14 ENCOUNTER — Other Ambulatory Visit: Payer: Self-pay | Admitting: Family Medicine

## 2021-09-14 DIAGNOSIS — K219 Gastro-esophageal reflux disease without esophagitis: Secondary | ICD-10-CM

## 2021-09-27 ENCOUNTER — Ambulatory Visit
Admission: RE | Admit: 2021-09-27 | Discharge: 2021-09-27 | Disposition: A | Discharge status: Home / Self Care | Payer: BC Managed Care – PPO | Source: Ambulatory Visit | Attending: Obstetrics & Gynecology | Admitting: Obstetrics & Gynecology

## 2021-09-27 DIAGNOSIS — Z1231 Encounter for screening mammogram for malignant neoplasm of breast: Secondary | ICD-10-CM

## 2022-04-17 ENCOUNTER — Other Ambulatory Visit: Payer: Self-pay | Admitting: Family Medicine

## 2022-08-24 ENCOUNTER — Other Ambulatory Visit: Payer: Self-pay | Admitting: Obstetrics & Gynecology

## 2022-08-24 DIAGNOSIS — Z1231 Encounter for screening mammogram for malignant neoplasm of breast: Secondary | ICD-10-CM

## 2022-09-29 ENCOUNTER — Ambulatory Visit
Admission: RE | Admit: 2022-09-29 | Discharge: 2022-09-29 | Disposition: A | Discharge status: Home / Self Care | Payer: BC Managed Care – PPO | Source: Ambulatory Visit | Attending: Obstetrics & Gynecology | Admitting: Obstetrics & Gynecology

## 2022-09-29 DIAGNOSIS — Z1231 Encounter for screening mammogram for malignant neoplasm of breast: Secondary | ICD-10-CM

## 2023-01-09 NOTE — Progress Notes (Unsigned)
[name] [address] [place] [address] Phone: [phone] Subjective:   [name], am serving as a scribe for Dr. Antoine Primas.  I'm seeing this patient by the request  of:  Pcp, No  CC: Low back pain  QMV:[id number]  07/02/2019 Significant improvement at this time.  We will continue with conservative therapy, discussed medication management including the gabapentin.  Patient given refill of meloxicam.  Follow-up with me again in 2 months   Updated 01/10/2023 Joanne [name] is a 62 y.o. female coming in with complaint of back pain.  Recently seen in the emergency room.  This was in the last week. She was unable to get out of her car last week due to insidious onset of lumbar spine pain. Went to ED and she has been improving since then but still is not 100%. Notices weakness in the R leg. R sided lumbar pain > than L. Ambulating with a cane since trip to ED.   Xray [facility] 01/04/2023 Lumbar Impression: Lumbar spondylosis with no acute findings.    Patient does have an MRI of the lumbar spine showing the patient does have some degenerative disc disease noted at multiple levels in 2021 but no significant nerve impingement noted.  Past Medical History:  Diagnosis Date   Arthritis    Diverticulitis    Endometriosis    Abdominal hysterectomy   GERD (gastroesophageal reflux disease) 06/2009   EGD with small gastric and duodenal erosions/inflammation.   Hamstring injury 2014   Right hamstring tear   Hay fever    Migraines    Past Surgical History:  Procedure Laterality Date   ABDOMINAL HYSTERECTOMY  1998   Endometriosis   APPENDECTOMY  1986   BREAST EXCISIONAL BIOPSY Right 2005   CHOLECYSTECTOMY  2010   ESOPHAGOGASTRODUODENOSCOPY  06/2009   EGD with small gastric and duodenal erosions/inflammation.   EXPLORATORY LAPAROTOMY  1988   x3 D/T endometriosis   OVARIAN CYST REMOVAL  08/2007   Social History   Socioeconomic History    Marital status: Married    Spouse name: Not on file   Number of children: 2   Years of education: 14   Highest education level: Not on file  Occupational History   Not on file  Tobacco Use   Smoking status: Never   Smokeless tobacco: Never  Vaping Use   Vaping status: Never Used  Substance and Sexual Activity   Alcohol use: Yes    Comment: occasional   Drug use: No   Sexual activity: Yes    Partners: Male  Other Topics Concern   Not on file  Social History Narrative   Marital status/children/pets: Married.  G2 P2   Education/employment: College degree, employed as a Solicitor:      -Wears a bicycle helmet riding a bike: Yes     -smoke alarm in the home:Yes     - wears seatbelt: Yes     - Feels safe in their relationships: Yes   Social Determinants of Health   Financial Resource Strain: Low Risk  (12/17/2022)   Received from [facility]   Overall Financial Resource Strain (CARDIA)    Difficulty of Paying Living Expenses: Not very hard  Food Insecurity: Food Insecurity Present (12/17/2022)   Received from [facility]   Hunger Vital Sign    Worried About Running Out of Food in the Last Year: Sometimes true    Ran Out of Food in  the Last Year: Sometimes true  Transportation Needs: No Transportation Needs (12/17/2022)   Received from [facility] - Transportation    Lack of Transportation (Medical): No    Lack of Transportation (Non-Medical): No  Physical Activity: Insufficiently Active (12/17/2022)   Received from [facility]   Exercise Vital Sign    Days of Exercise per Week: 4 days    Minutes of Exercise per Session: 20 min  Stress: No Stress Concern Present (12/17/2022)   Received from [facility] - Occupational Stress Questionnaire    Feeling of Stress : Only a little  Social Connections: Socially Integrated (12/17/2022)   Received from [facility]   Social Network    How would you rate your social  network (family, work, friends)?: Good participation with social networks   Allergies  Allergen Reactions   Penicillins Other (See Comments)    GI upset   Tape Other (See Comments)    Welts and pain   Family History  Problem Relation Age of Onset   Breast cancer Mother    Arthritis Mother    Hypertension Mother    Miscarriages / Stillbirths Mother    Diabetes Mother    Arthritis Father    Heart attack Father    Diabetes Father    Heart disease Father    Early death Father       Current Outpatient Medications (Cardiovascular):    spironolactone (ALDACTONE) 25 MG tablet, Take 25 mg by mouth in the morning and at bedtime.   Current Outpatient Medications (Respiratory):    albuterol (PROVENTIL HFA;VENTOLIN HFA) 108 (90 Base) MCG/ACT inhaler, Inhale 2 puffs into the lungs every 4 (four) hours as needed for wheezing or shortness of breath.   albuterol (VENTOLIN HFA) 108 (90 Base) MCG/ACT inhaler, Inhale 2 puffs into the lungs every 6 (six) hours as needed for wheezing or shortness of breath.   benzonatate (TESSALON) 100 MG capsule, Take 1 capsule (100 mg total) by mouth 3 (three) times daily as needed for cough.   benzonatate (TESSALON) 200 MG capsule, Take 1 capsule (200 mg total) by mouth 2 (two) times daily as needed for cough.   cetirizine (ZYRTEC) 10 MG tablet, Take 10 mg by mouth daily. Patient uses this medication for her allergies.   fluticasone (FLONASE) 50 MCG/ACT nasal spray, Place 2 sprays into both nostrils daily.   promethazine (PHENERGAN) 25 MG tablet, Take 1 tablet (25 mg total) by mouth every 8 (eight) hours as needed for nausea or vomiting.   Current Outpatient Medications (Analgesics):    meloxicam (MOBIC) 15 MG tablet, Take 1 tablet (15 mg total) by mouth daily.     Current Outpatient Medications (Other):    Adapalene-Benzoyl Peroxide 0.1-2.5 % gel, Apply topically daily.   cyclobenzaprine (FLEXERIL) 5 MG tablet, Take 1-2 tablets (5-10 mg total) by mouth 2  (two) times daily as needed for muscle spasms.   gabapentin (NEURONTIN) 100 MG capsule, Take 2 capsules (200 mg total) by mouth at bedtime.   omeprazole (PRILOSEC) 20 MG capsule, Take 1 capsule (20 mg total) by mouth 2 (two) times daily before a meal.   Facility-Administered Medications Ordered in Other Visits (Other):    gadobutrol (GADAVIST) 1 MMOL/ML injection 10 mL No current facility-administered medications for this visit.   Reviewed prior external information including notes and imaging from  primary care provider As well as notes that were available from care everywhere and other healthcare systems.  Past medical history, social, surgical and family history all reviewed in electronic medical record.  No pertanent information unless stated regarding to the chief complaint.   Review of Systems:  No headache, visual changes, nausea, vomiting, diarrhea, constipation, dizziness, abdominal pain, skin rash, fevers, chills, night sweats, weight loss, swollen lymph nodes, body aches, joint swelling, chest pain, shortness of breath, mood changes. POSITIVE muscle aches  Objective  Blood pressure 138/88, pulse 75, height 5\' 9"  (1.753 m), weight 223 lb (101.2 kg), SpO2 98%.   General: No apparent distress alert and oriented x3 mood and affect normal, dressed appropriately.  HEENT: Pupils equal, extraocular movements intact  Respiratory: Patient's speak in full sentences and does not appear short of breath  Cardiovascular: No lower extremity edema, non tender, no erythema  Low back exam shows significant loss of lordosis noted.  Patient does have a positive straight leg test at 20 degrees of forward flexion.  Severe tenderness in the L3-S1 areas on the right side of the back.  Patient does have significant weakness with 2 out of 5 strength compared to 5 out of 5 strength dorsi flexion on the contralateral foot.  Patient also has no DTR on the right side with 2+ on the left side of the  Achilles.    Impression and Recommendations:    The above documentation has been reviewed and is accurate and complete Judi Saa, DO

## 2023-01-10 ENCOUNTER — Ambulatory Visit: Payer: BC Managed Care – PPO | Admitting: Family Medicine

## 2023-01-10 ENCOUNTER — Encounter: Payer: Self-pay | Admitting: Family Medicine

## 2023-01-10 VITALS — BP 138/88 | HR 75 | Ht 69.0 in | Wt 223.0 lb

## 2023-01-10 DIAGNOSIS — M545 Low back pain, unspecified: Secondary | ICD-10-CM

## 2023-01-10 DIAGNOSIS — M5416 Radiculopathy, lumbar region: Secondary | ICD-10-CM | POA: Diagnosis not present

## 2023-01-10 MED ORDER — GABAPENTIN 100 MG PO CAPS: 200.0000 mg | ORAL_CAPSULE | Freq: Every day | ORAL | 0 refills | Status: DC

## 2023-01-10 NOTE — Patient Instructions (Signed)
MRI lumbar [place] Gabapentin 200mg  at night No lifting greater than 5# allow seated work when possible

## 2023-01-10 NOTE — Assessment & Plan Note (Signed)
Patient does have lumbar radiculopathy and does have significant weakness noted with 2 out of 5 strength with dorsi flexion and decreased DTRs of the Achilles.  Very concerned that this is more of a nerve impingement in the L5-S1 area.  Patient's pain is out of proportion as well.  Does have some difficulty with ambulation.  Due to the acute nature of this area and patient's weakness I do feel that advanced imaging is warranted.  Could be a potential candidate for epidurals if needed but also discussed with patient that if any bowel or bladder incontinence occurs to seek medical attention immediately.  Patient given gabapentin, encouraged her to continue the medications including the muscle relaxer at night for now.  Worsening pain to go to the emergency room.  Will discuss with patient again once the advanced imaging comes back

## 2023-01-11 ENCOUNTER — Other Ambulatory Visit: Payer: Self-pay | Admitting: Family Medicine

## 2023-01-11 ENCOUNTER — Telehealth: Payer: Self-pay | Admitting: Family Medicine

## 2023-01-11 MED ORDER — DIAZEPAM 5 MG PO TABS: ORAL_TABLET | ORAL | 0 refills | Status: AC

## 2023-01-11 NOTE — Progress Notes (Signed)
Sent in Valium for patient's MRI

## 2023-01-11 NOTE — Telephone Encounter (Signed)
Pt has claustrophobia and is requesting med for her MRI on Saturday. [facility] on file.

## 2023-01-12 NOTE — Telephone Encounter (Signed)
Confirmed with pt, RX picked up.

## 2023-01-13 ENCOUNTER — Other Ambulatory Visit: Payer: BC Managed Care – PPO

## 2023-01-13 DIAGNOSIS — M545 Low back pain, unspecified: Secondary | ICD-10-CM | POA: Diagnosis not present

## 2023-01-23 ENCOUNTER — Encounter: Payer: Self-pay | Admitting: Family Medicine

## 2023-01-26 ENCOUNTER — Other Ambulatory Visit: Payer: Self-pay

## 2023-01-26 DIAGNOSIS — M5416 Radiculopathy, lumbar region: Secondary | ICD-10-CM

## 2023-01-29 ENCOUNTER — Other Ambulatory Visit: Payer: Self-pay

## 2023-01-29 ENCOUNTER — Encounter: Payer: Self-pay | Admitting: Neurology

## 2023-01-29 DIAGNOSIS — R202 Paresthesia of skin: Secondary | ICD-10-CM

## 2023-02-09 ENCOUNTER — Ambulatory Visit: Payer: BC Managed Care – PPO | Admitting: Neurology

## 2023-02-09 DIAGNOSIS — R202 Paresthesia of skin: Secondary | ICD-10-CM | POA: Diagnosis not present

## 2023-02-09 NOTE — Procedures (Signed)
[facility]  [address] [place], [address]  [place], [place] [address] Tel: [phone] Fax: [phone] Test Date:  02/09/2023  Patient: Joanne [name] DOB: [birth year] Physician: Nita Sickle, DO  Sex: Female Height: 5\' 9"  Ref Phys: Antoine Primas, DO  ID#: [id number]   Technician:    History: This is a 62 year old female referred for evaluation of right foot weakness.  NCV & EMG Findings: Electrodiagnostic testing of the right lower extremity and additional studies of the left shows: Bilateral sural and superficial peroneal sensory responses are within normal limits. Bilateral peroneal and tibial motor responses are within normal limits. Bilateral tibial H reflex studies are within normal limits. There is no evidence of active or chronic motor axonal changes affecting any of the tested muscles.  There is a global pattern of incomplete motor unit activation as seen by variable motor unit recruitment; these findings may be due to pain, poor effort, or central disorder of motor unit control.     Impression: This is a normal study of the lower extremities.  In particular, there is no evidence of a large fiber sensorimotor polyneuropathy, lumbosacral radiculopathy, or peroneal neuropathy.    Of note, testing was notable for reduced motor activation, which may be due to pain, poor effort, or central disorder of motor unit control.    ___________________________ Nita Sickle, DO    Nerve Conduction Studies   Stim Site NR Peak (ms) Norm Peak (ms) O-P Amp (V) Norm O-P Amp  Left Sup Peroneal Anti Sensory (Ant Lat Mall)  32 C  12 cm    2.8 <4.6 10.7 >3  Right Sup Peroneal Anti Sensory (Ant Lat Mall)  32 C  12 cm    2.8 <4.6 8.5 >3  Left Sural Anti Sensory (Lat Mall)  32 C  Calf    3.2 <4.6 11.2 >3  Right Sural Anti Sensory (Lat Mall)  32 C  Calf    2.7 <4.6 13.3 >3     Stim Site NR Onset (ms) Norm Onset (ms) O-P Amp (mV) Norm O-P Amp Site1 Site2 Delta-0 (ms) Dist  (cm) Vel (m/s) Norm Vel (m/s)  Left Peroneal Motor (Ext Dig Brev)  32 C  Ankle    3.5 <6.0 5.8 >2.5 B Fib Ankle 8.7 41.0 47 >40  B Fib    12.2  5.8  Poplt B Fib 1.2 8.0 67 >40  Poplt    13.4  5.6         Right Peroneal Motor (Ext Dig Brev)  32 C  Ankle    2.8 <6.0 6.0 >2.5 B Fib Ankle 7.7 41.0 53 >40  B Fib    10.5  5.5  Poplt B Fib 1.6 10.0 63 >40  Poplt    12.1  5.5         Left Peroneal TA Motor (Tib Ant)  32 C  Fib [name]    2.9 <4.5 4.6 >3 Poplit Fib [name] 1.1 8.0 73 >40  Poplit    4.0 <5.7 4.6         Right Peroneal TA Motor (Tib Ant)  32 C  Fib [name]    2.7 <4.5 4.6 >3 Poplit Fib [name] 1.4 10.0 71 >40  Poplit    4.1 <5.7 4.4         Left Tibial Motor (Abd Hall Brev)  32 C  Ankle    3.9 <6.0 11.5 >4 Knee Ankle 8.8 44.0 50 >40  Knee    12.7  7.9  Right Tibial Motor (Abd Hall Brev)  32 C  Ankle    4.1 <6.0 9.7 >4 Knee Ankle 8.5 48.0 56 >40  Knee    12.6  6.8          Electromyography   Side Muscle Ins.Act Fibs Fasc Recrt Amp Dur Poly Activation Comment  Right AntTibialis Nml Nml Nml *2- Nml Nml Nml *Variable N/A  Right Gastroc Nml Nml Nml *2- Nml Nml Nml *Variable N/A  Right Flex Dig Long Nml Nml Nml *2- Nml Nml Nml *Variable N/A  Right RectFemoris Nml Nml Nml *1- Nml Nml Nml *Variable N/A  Right BicepsFemS Nml Nml Nml Nml Nml Nml Nml Nml N/A  Right GluteusMed Nml Nml Nml Nml Nml Nml Nml Nml N/A  Left AntTibialis Nml Nml Nml *1- Nml Nml Nml *Variable N/A  Left Gastroc Nml Nml Nml *1- Nml Nml Nml *Variable N/A  Left Flex Dig Long Nml Nml Nml *2- Nml Nml Nml *Variable N/A  Left RectFemoris Nml Nml Nml Nml Nml Nml Nml Nml N/A  Left GluteusMed Nml Nml Nml Nml Nml Nml Nml Nml N/A  Left BicepsFemS Nml Nml Nml Nml Nml Nml Nml Nml N/A      Waveforms:

## 2023-02-12 ENCOUNTER — Encounter: Payer: Self-pay | Admitting: Family Medicine

## 2023-02-13 NOTE — Telephone Encounter (Signed)
Patient called back to follow up to see when these might be read and available for her to see.

## 2023-03-01 ENCOUNTER — Encounter: Payer: BC Managed Care – PPO | Admitting: Neurology

## 2023-04-13 ENCOUNTER — Ambulatory Visit: Payer: 59 | Admitting: Podiatry

## 2023-04-13 ENCOUNTER — Encounter: Payer: Self-pay | Admitting: Podiatry

## 2023-04-13 DIAGNOSIS — G5762 Lesion of plantar nerve, left lower limb: Secondary | ICD-10-CM

## 2023-04-13 NOTE — Progress Notes (Signed)
  Subjective:  Patient ID: Joanne [name], female    DOB: 03/11/1961,   MRN: [id number]  No chief complaint on file.   63 y.o. female presents for concern of left foot neuroma that she has been dealing with for about a year. She has been following with [facility] and Dr. Christine. She was referred here by Dr. Christine after having tried 5 shots in the foot and various inserts and shoes to relief the pain.Here to discuss surgical treatment options.  She has had X-rays and an  ultrasound of the foot and confirmed neuroma in the third itnerspace.  . Denies any other pedal complaints. Denies n/v/f/c.   Past Medical History:  Diagnosis Date   Arthritis    Diverticulitis    Endometriosis    Abdominal hysterectomy   GERD (gastroesophageal reflux disease) 06/2009   EGD with small gastric and duodenal erosions/inflammation.   Hamstring injury 2014   Right hamstring tear   Hay fever    Migraines     Objective:  Physical Exam: Vascular: DP/PT pulses 2/4 bilateral. CFT <3 seconds. Normal hair growth on digits. No edema.  Skin. No lacerations or abrasions bilateral feet.  Musculoskeletal: MMT 5/5 bilateral lower extremities in DF, PF, Inversion and Eversion. Deceased ROM in DF of ankle joint. Tender in the third interspace area and plantarly along the third metatrarsal area. Pain with metatarsal squeeze and positive mulders click noted.  Neurological: Sensation intact to light touch.   US  left foot  FINDINGS:  # Hypoechoic mass in the third interspace measuring 8 x 5 mm. There is an associated positive Mulder sign.  #  Mild superimposed intermetatarsal bursitis along the third interspace. This component is compressible.  #  The plantar nerve tapers to 1-2 mm distal to the mass.   Assessment:   1. Morton's neuroma, left      Plan:  Patient was evaluated and treated and all questions answered. Discussed neuroma and treatment options with patient.  Radiographs reviewed and discussed with  patient. US  reviewed and discussed with patient.  Injection deferred as has failed injections in past.   Discussed padding and offloading today.  Patient has exhausted conservative treatments and here today to discuss surgical excision of the neuroma. Discussed in detail the perioperative course. 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 Will try and plan for spring break.  -Informed surgical risk consent was reviewed and read aloud to the patient.  I reviewed the films.  I have discussed my findings with the patient in great detail.  I have discussed all risks including but not limited to infection, stiffness, scarring, limp, disability, deformity, damage to blood vessels and nerves, numbness, poor healing, need for braces, arthritis, chronic pain, amputation, death.  All benefits and realistic expectations discussed in great detail.  I have made no promises as to the outcome.  I have provided realistic expectations.  I have offered the patient a 2nd opinion, which they have declined and assured me they preferred to proceed despite the risks. Tentatively plan for surgery 3/11.  Post-op meds: Percocet 5/325, zofran .      Asberry Failing, DPM

## 2023-04-23 ENCOUNTER — Encounter: Payer: Self-pay | Admitting: Podiatry

## 2023-05-18 ENCOUNTER — Telehealth: Payer: Self-pay | Admitting: Podiatry

## 2023-05-18 NOTE — Telephone Encounter (Signed)
 DOS-06/19/23  NEURECTOMY [id number]  AETNA EFFECTIVE DATE- 04/11/23  PER THE AETNA AUTOMATED SYSTEM, PRIOR AUTH IS NOT REQUIRED FOR CPT CODE 25053.  CALL REF #: [id number]

## 2023-06-19 ENCOUNTER — Other Ambulatory Visit: Payer: Self-pay | Admitting: Podiatry

## 2023-06-19 DIAGNOSIS — G5762 Lesion of plantar nerve, left lower limb: Secondary | ICD-10-CM | POA: Diagnosis not present

## 2023-06-19 MED ORDER — OXYCODONE-ACETAMINOPHEN 5-325 MG PO TABS: 1.0000 | ORAL_TABLET | ORAL | 0 refills | Status: AC | PRN

## 2023-06-19 MED ORDER — ONDANSETRON HCL 4 MG PO TABS: 4.0000 mg | ORAL_TABLET | Freq: Three times a day (TID) | ORAL | 0 refills | Status: AC | PRN

## 2023-06-25 ENCOUNTER — Other Ambulatory Visit: Payer: Self-pay | Admitting: Podiatry

## 2023-06-28 ENCOUNTER — Encounter: Payer: Self-pay | Admitting: Podiatry

## 2023-06-28 ENCOUNTER — Ambulatory Visit (INDEPENDENT_AMBULATORY_CARE_PROVIDER_SITE_OTHER): Admitting: Podiatry

## 2023-06-28 ENCOUNTER — Ambulatory Visit (INDEPENDENT_AMBULATORY_CARE_PROVIDER_SITE_OTHER)

## 2023-06-28 ENCOUNTER — Encounter: Payer: 59 | Admitting: Podiatry

## 2023-06-28 ENCOUNTER — Other Ambulatory Visit: Payer: Self-pay

## 2023-06-28 DIAGNOSIS — G5762 Lesion of plantar nerve, left lower limb: Secondary | ICD-10-CM

## 2023-06-28 DIAGNOSIS — Z9889 Other specified postprocedural states: Secondary | ICD-10-CM

## 2023-06-28 NOTE — Progress Notes (Signed)
 Subjective:  Patient ID: Joanne [name], female    DOB: [birth year],  MRN: [id number]  No chief complaint on file.   DOS: 06/19/23 Procedure: Left foot neuroma exicsion  63 y.o. female returns for POV#1. Relates doing well and tolerating pain  Review of Systems: Negative except as noted in the HPI. Denies N/V/F/Ch.  Past Medical History:  Diagnosis Date   Arthritis    Diverticulitis    Endometriosis    Abdominal hysterectomy   GERD (gastroesophageal reflux disease) 06/2009   EGD with small gastric and duodenal erosions/inflammation.   Hamstring injury 2014   Right hamstring tear   Hay fever    Migraines     Current Outpatient Medications:    ondansetron (ZOFRAN) 4 MG tablet, Take 1 tablet (4 mg total) by mouth every 8 (eight) hours as needed for nausea or vomiting., Disp: 20 tablet, Rfl: 0   Adapalene-Benzoyl Peroxide 0.1-2.5 % gel, Apply topically daily., Disp: , Rfl:    albuterol (PROVENTIL HFA;VENTOLIN HFA) 108 (90 Base) MCG/ACT inhaler, Inhale 2 puffs into the lungs every 4 (four) hours as needed for wheezing or shortness of breath., Disp: 1 Inhaler, Rfl: 0   albuterol (VENTOLIN HFA) 108 (90 Base) MCG/ACT inhaler, Inhale 2 puffs into the lungs every 6 (six) hours as needed for wheezing or shortness of breath., Disp: 8 g, Rfl: 0   benzonatate (TESSALON) 100 MG capsule, Take 1 capsule (100 mg total) by mouth 3 (three) times daily as needed for cough., Disp: 30 capsule, Rfl: 0   benzonatate (TESSALON) 200 MG capsule, Take 1 capsule (200 mg total) by mouth 2 (two) times daily as needed for cough., Disp: 20 capsule, Rfl: 0   cetirizine (ZYRTEC) 10 MG tablet, Take 10 mg by mouth daily. Patient uses this medication for her allergies., Disp: , Rfl:    cyclobenzaprine (FLEXERIL) 5 MG tablet, Take 1-2 tablets (5-10 mg total) by mouth 2 (two) times daily as needed for muscle spasms., Disp: 90 tablet, Rfl: 1   diazepam (VALIUM) 5 MG tablet, One tab by mouth, 2 hours before procedure., Disp:  2 tablet, Rfl: 0   fluticasone (FLONASE) 50 MCG/ACT nasal spray, Place 2 sprays into both nostrils daily., Disp: 16 g, Rfl: 6   gabapentin (NEURONTIN) 100 MG capsule, Take 2 capsules (200 mg total) by mouth at bedtime., Disp: 180 capsule, Rfl: 0   meloxicam (MOBIC) 15 MG tablet, Take 1 tablet (15 mg total) by mouth daily., Disp: 90 tablet, Rfl: 3   omeprazole (PRILOSEC) 20 MG capsule, Take 1 capsule (20 mg total) by mouth 2 (two) times daily before a meal., Disp: 180 capsule, Rfl: 3   promethazine (PHENERGAN) 25 MG tablet, Take 1 tablet (25 mg total) by mouth every 8 (eight) hours as needed for nausea or vomiting., Disp: 20 tablet, Rfl: 0   spironolactone (ALDACTONE) 25 MG tablet, Take 25 mg by mouth in the morning and at bedtime., Disp: , Rfl:  No current facility-administered medications for this visit.  Facility-Administered Medications Ordered in Other Visits:    gadobutrol (GADAVIST) 1 MMOL/ML injection 10 mL, 10 mL, Intravenous, Once PRN, Delford Field, Kristen M, PA-C  Social History   Tobacco Use  Smoking Status Never  Smokeless Tobacco Never    Allergies  Allergen Reactions   Penicillins Other (See Comments)    GI upset   Tape Other (See Comments)    Welts and pain   Objective:  There were no vitals filed for this visit. There is no height or  weight on file to calculate BMI. Constitutional Well developed. Well nourished.  Vascular Foot warm and well perfused. Capillary refill normal to all digits.   Neurologic Normal speech. Oriented to person, place, and time. Epicritic sensation to light touch grossly present bilaterally.  Dermatologic Skin healing well without signs of infection. Skin edges well coapted without signs of infection.  Orthopedic: Tenderness to palpation noted about the surgical site.   Radiographs: No change in osseous structures Assessment:   1. Post-operative state   2. Morton's neuroma, left    Plan:  Patient was evaluated and treated and all  questions answered.  S/p foot surgery left -Progressing as expected post-operatively. -WB Status: WBAT in surgical shoe -Sutures: intact. -Medications: n/a -Foot redressed.  Return in 2 weeks for suture removal.     No follow-ups on file.

## 2023-07-12 ENCOUNTER — Encounter: Payer: 59 | Admitting: Podiatry

## 2023-07-12 ENCOUNTER — Ambulatory Visit (INDEPENDENT_AMBULATORY_CARE_PROVIDER_SITE_OTHER): Admitting: Podiatry

## 2023-07-12 ENCOUNTER — Encounter: Payer: Self-pay | Admitting: Podiatry

## 2023-07-12 DIAGNOSIS — G5762 Lesion of plantar nerve, left lower limb: Secondary | ICD-10-CM

## 2023-07-12 DIAGNOSIS — Z9889 Other specified postprocedural states: Secondary | ICD-10-CM

## 2023-07-12 NOTE — Progress Notes (Signed)
 Subjective:  Patient ID: Joanne [name], female    DOB: Aug 28, 1960,  MRN: [id number]  No chief complaint on file.   DOS: 06/19/23 Procedure: Left foot neuroma exicsion  63 y.o. female returns for POV#2. Relates doing well and getting better everyday but still does have some pain.   Review of Systems: Negative except as noted in the HPI. Denies N/V/F/Ch.  Past Medical History:  Diagnosis Date   Arthritis    Diverticulitis    Endometriosis    Abdominal hysterectomy   GERD (gastroesophageal reflux disease) 06/2009   EGD with small gastric and duodenal erosions/inflammation.   Hamstring injury 2014   Right hamstring tear   Hay fever    Migraines     Current Outpatient Medications:    ondansetron (ZOFRAN) 4 MG tablet, Take 1 tablet (4 mg total) by mouth every 8 (eight) hours as needed for nausea or vomiting., Disp: 20 tablet, Rfl: 0   Adapalene-Benzoyl Peroxide 0.1-2.5 % gel, Apply topically daily., Disp: , Rfl:    albuterol (PROVENTIL HFA;VENTOLIN HFA) 108 (90 Base) MCG/ACT inhaler, Inhale 2 puffs into the lungs every 4 (four) hours as needed for wheezing or shortness of breath., Disp: 1 Inhaler, Rfl: 0   albuterol (VENTOLIN HFA) 108 (90 Base) MCG/ACT inhaler, Inhale 2 puffs into the lungs every 6 (six) hours as needed for wheezing or shortness of breath., Disp: 8 g, Rfl: 0   benzonatate (TESSALON) 100 MG capsule, Take 1 capsule (100 mg total) by mouth 3 (three) times daily as needed for cough., Disp: 30 capsule, Rfl: 0   benzonatate (TESSALON) 200 MG capsule, Take 1 capsule (200 mg total) by mouth 2 (two) times daily as needed for cough., Disp: 20 capsule, Rfl: 0   cetirizine (ZYRTEC) 10 MG tablet, Take 10 mg by mouth daily. Patient uses this medication for her allergies., Disp: , Rfl:    cyclobenzaprine (FLEXERIL) 5 MG tablet, Take 1-2 tablets (5-10 mg total) by mouth 2 (two) times daily as needed for muscle spasms., Disp: 90 tablet, Rfl: 1   diazepam (VALIUM) 5 MG tablet, One tab by  mouth, 2 hours before procedure., Disp: 2 tablet, Rfl: 0   fluticasone (FLONASE) 50 MCG/ACT nasal spray, Place 2 sprays into both nostrils daily., Disp: 16 g, Rfl: 6   gabapentin (NEURONTIN) 100 MG capsule, Take 2 capsules (200 mg total) by mouth at bedtime., Disp: 180 capsule, Rfl: 0   meloxicam (MOBIC) 15 MG tablet, Take 1 tablet (15 mg total) by mouth daily., Disp: 90 tablet, Rfl: 3   omeprazole (PRILOSEC) 20 MG capsule, Take 1 capsule (20 mg total) by mouth 2 (two) times daily before a meal., Disp: 180 capsule, Rfl: 3   promethazine (PHENERGAN) 25 MG tablet, Take 1 tablet (25 mg total) by mouth every 8 (eight) hours as needed for nausea or vomiting., Disp: 20 tablet, Rfl: 0   spironolactone (ALDACTONE) 25 MG tablet, Take 25 mg by mouth in the morning and at bedtime., Disp: , Rfl:  No current facility-administered medications for this visit.  Facility-Administered Medications Ordered in Other Visits:    gadobutrol (GADAVIST) 1 MMOL/ML injection 10 mL, 10 mL, Intravenous, Once PRN, Delford Field, Kristen M, PA-C  Social History   Tobacco Use  Smoking Status Never  Smokeless Tobacco Never    Allergies  Allergen Reactions   Penicillins Other (See Comments)    GI upset   Tape Other (See Comments)    Welts and pain   Objective:  There were no vitals filed  for this visit. There is no height or weight on file to calculate BMI. Constitutional Well developed. Well nourished.  Vascular Foot warm and well perfused. Capillary refill normal to all digits.   Neurologic Normal speech. Oriented to person, place, and time. Epicritic sensation to light touch grossly present bilaterally.  Dermatologic Skin healing well without signs of infection. Skin edges well coapted without signs of infection.  Orthopedic: Tenderness to palpation noted about the surgical site.   Radiographs: No change in osseous structures Assessment:   1. Post-operative state   2. Morton's neuroma, left     Plan:   Patient was evaluated and treated and all questions answered.  S/p foot surgery left -Progressing as expected post-operatively. -WB Status: WBAT in surgical shoe -Sutures: removed without incident.  -Medications: n/a -Foot redressed.  Return in 3 weeks for recheck     No follow-ups on file.

## 2023-08-03 ENCOUNTER — Ambulatory Visit: Admitting: Podiatry

## 2023-08-03 ENCOUNTER — Encounter: Payer: Self-pay | Admitting: Podiatry

## 2023-08-03 DIAGNOSIS — Z9889 Other specified postprocedural states: Secondary | ICD-10-CM

## 2023-08-03 NOTE — Progress Notes (Signed)
 Subjective:  Patient ID: Joanne [name], female    DOB: 1960-05-13,  MRN: [id number]  No chief complaint on file.   DOS: 06/19/23 Procedure: Left foot neuroma exicsion  63 y.o. female returns for POV#3. Relates doing well and getting better everyday but still does have some pain.    Review of Systems: Negative except as noted in the HPI. Denies N/V/F/Ch.  Past Medical History:  Diagnosis Date   Arthritis    Diverticulitis    Endometriosis    Abdominal hysterectomy   GERD (gastroesophageal reflux disease) 06/2009   EGD with small gastric and duodenal erosions/inflammation.   Hamstring injury 2014   Right hamstring tear   Hay fever    Migraines     Current Outpatient Medications:    ondansetron  (ZOFRAN ) 4 MG tablet, Take 1 tablet (4 mg total) by mouth every 8 (eight) hours as needed for nausea or vomiting., Disp: 20 tablet, Rfl: 0   Adapalene-Benzoyl Peroxide 0.1-2.5 % gel, Apply topically daily., Disp: , Rfl:    albuterol  (PROVENTIL  HFA;VENTOLIN  HFA) 108 (90 Base) MCG/ACT inhaler, Inhale 2 puffs into the lungs every 4 (four) hours as needed for wheezing or shortness of breath., Disp: 1 Inhaler, Rfl: 0   albuterol  (VENTOLIN  HFA) 108 (90 Base) MCG/ACT inhaler, Inhale 2 puffs into the lungs every 6 (six) hours as needed for wheezing or shortness of breath., Disp: 8 g, Rfl: 0   benzonatate  (TESSALON ) 100 MG capsule, Take 1 capsule (100 mg total) by mouth 3 (three) times daily as needed for cough., Disp: 30 capsule, Rfl: 0   benzonatate  (TESSALON ) 200 MG capsule, Take 1 capsule (200 mg total) by mouth 2 (two) times daily as needed for cough., Disp: 20 capsule, Rfl: 0   cetirizine (ZYRTEC) 10 MG tablet, Take 10 mg by mouth daily. Patient uses this medication for her allergies., Disp: , Rfl:    cyclobenzaprine  (FLEXERIL ) 5 MG tablet, Take 1-2 tablets (5-10 mg total) by mouth 2 (two) times daily as needed for muscle spasms., Disp: 90 tablet, Rfl: 1   diazepam  (VALIUM ) 5 MG tablet, One tab  by mouth, 2 hours before procedure., Disp: 2 tablet, Rfl: 0   fluticasone  (FLONASE ) 50 MCG/ACT nasal spray, Place 2 sprays into both nostrils daily., Disp: 16 g, Rfl: 6   gabapentin  (NEURONTIN ) 100 MG capsule, Take 2 capsules (200 mg total) by mouth at bedtime., Disp: 180 capsule, Rfl: 0   meloxicam  (MOBIC ) 15 MG tablet, Take 1 tablet (15 mg total) by mouth daily., Disp: 90 tablet, Rfl: 3   omeprazole  (PRILOSEC) 20 MG capsule, Take 1 capsule (20 mg total) by mouth 2 (two) times daily before a meal., Disp: 180 capsule, Rfl: 3   promethazine  (PHENERGAN ) 25 MG tablet, Take 1 tablet (25 mg total) by mouth every 8 (eight) hours as needed for nausea or vomiting., Disp: 20 tablet, Rfl: 0   spironolactone (ALDACTONE) 25 MG tablet, Take 25 mg by mouth in the morning and at bedtime., Disp: , Rfl:  No current facility-administered medications for this visit.  Facility-Administered Medications Ordered in Other Visits:    gadobutrol  (GADAVIST ) 1 MMOL/ML injection 10 mL, 10 mL, Intravenous, Once PRN, Brent Cambric, Kristen M, PA-C  Social History   Tobacco Use  Smoking Status Never  Smokeless Tobacco Never    Allergies  Allergen Reactions   Penicillins Other (See Comments)    GI upset   Tape Other (See Comments)    Welts and pain   Objective:  There were no vitals  filed for this visit. There is no height or weight on file to calculate BMI. Constitutional Well developed. Well nourished.  Vascular Foot warm and well perfused. Capillary refill normal to all digits.   Neurologic Normal speech. Oriented to person, place, and time. Epicritic sensation to light touch grossly present bilaterally.  Dermatologic Skin healing well without signs of infection. Skin edges well coapted without signs of infection.  Orthopedic: Tenderness to palpation noted about the surgical site.   Radiographs: No change in osseous structures Assessment:   1. Post-operative state      Plan:  Patient was evaluated and  treated and all questions answered.  S/p foot surgery left -Progressing as expected post-operatively. -WB Status: WBAT in regular shoe -Medications: n/a   Return as needed. Discharged from surgical standpoing    Return if symptoms worsen or fail to improve.

## 2023-08-13 ENCOUNTER — Encounter: Payer: Self-pay | Admitting: Podiatry

## 2023-08-13 ENCOUNTER — Other Ambulatory Visit: Payer: Self-pay | Admitting: Podiatry

## 2023-08-13 MED ORDER — GABAPENTIN 300 MG PO CAPS: 300.0000 mg | ORAL_CAPSULE | Freq: Every day | ORAL | 3 refills | Status: AC

## 2023-09-10 ENCOUNTER — Other Ambulatory Visit: Payer: Self-pay | Admitting: Obstetrics & Gynecology

## 2023-09-10 DIAGNOSIS — Z1231 Encounter for screening mammogram for malignant neoplasm of breast: Secondary | ICD-10-CM

## 2023-10-01 ENCOUNTER — Ambulatory Visit
Admission: RE | Admit: 2023-10-01 | Discharge: 2023-10-01 | Disposition: A | Discharge status: Home / Self Care | Source: Ambulatory Visit | Attending: Obstetrics & Gynecology | Admitting: Obstetrics & Gynecology

## 2023-10-01 DIAGNOSIS — Z1231 Encounter for screening mammogram for malignant neoplasm of breast: Secondary | ICD-10-CM

## 2023-11-12 ENCOUNTER — Encounter: Payer: Self-pay | Admitting: Podiatry

## 2023-12-14 ENCOUNTER — Encounter: Payer: Self-pay | Admitting: Podiatry

## 2023-12-14 ENCOUNTER — Ambulatory Visit: Admitting: Podiatry

## 2023-12-14 DIAGNOSIS — G5762 Lesion of plantar nerve, left lower limb: Secondary | ICD-10-CM

## 2023-12-14 MED ORDER — PREGABALIN 50 MG PO CAPS: 50.0000 mg | ORAL_CAPSULE | Freq: Two times a day (BID) | ORAL | 2 refills | Status: DC

## 2023-12-14 NOTE — Patient Instructions (Addendum)
 Alcohol sclerosing injections are a conservative therapy in the treatment of painful neuromas. Alcohol sclerosing injections are a form of chemical neurolysis that is made of a sterile solution of 4% ethyl alcohol mixed with a long-acting Novocain containing epinephrine. This procedure is performed in the office setting and consists of an injection of the mixture just proximal to and around the neuroma. The alcohol injection penetrates into the nerve and causes degeneration and atrophy of the neuroma. The effect is to eliminate the function of the nerve. This injection is given every 10 to 14 days until symptoms subside. Most cases require about 3 to 7 injections and has shown an approximately 90% success rate. Advantages of alcohol sclerosing injections include quick recovery, no downtime and in most cases the ability to avoid surgery.  However surgery in the form of excision or removing of the neuroma might be warranted in the event your symptoms fail with extensive conservative treatment options.  PRP (platelet-rich plasma) injections are a potential treatment for Morton's neuroma and other neuromas, using a concentration of the patient's own platelets to stimulate tissue healing and reduce pain. The procedure involves drawing blood, processing it to isolate the platelets, and then injecting the enriched plasma into the damaged tissue, often under ultrasound guidance for precision. While PRP injections are generally safe and promote natural healing by releasing growth factors, success rates vary, and avoidance of NSAIDs (non-steroidal anti-inflammatory drugs) during the recovery period is recommended to ensure treatment effectiveness.

## 2023-12-14 NOTE — Progress Notes (Signed)
  Subjective:  Patient ID: Joanne [name], female    DOB: 1960/08/17,   MRN: [id number]  Chief Complaint  Patient presents with   Post-op Problem    DOS: 06/19/23  Left foot neuroma exicsion It's not good, the surgery did not work.  The pain doesn't shoot through my toes anymore.  The pain seems to be lower now.       63 y.o. female presents for concern of continued pain in left foot. Relates still getting pain. Relates the surgery just moved pain from one spot to another.  Has tried gabapnetin. Has had injections that just made things worse. She has tried padding without much help and continues to have trouble. . Denies any other pedal complaints. Denies n/v/f/c.   Past Medical History:  Diagnosis Date   Arthritis    Diverticulitis    Endometriosis    Abdominal hysterectomy   GERD (gastroesophageal reflux disease) 06/2009   EGD with small gastric and duodenal erosions/inflammation.   Hamstring injury 2014   Right hamstring tear   Hay fever    Migraines     Objective:  Physical Exam: Vascular: DP/PT pulses 2/4 bilateral. CFT <3 seconds. Normal hair growth on digits. No edema.  Skin. No lacerations or abrasions bilateral feet.  Musculoskeletal: MMT 5/5 bilateral lower extremities in DF, PF, Inversion and Eversion. Deceased ROM in DF of ankle joint. Tender more proximally in third interspace on the left with palpable swelling noted plantarly. Negative mulders click  Neurological: Sensation intact to light touch.   Assessment:   1. Morton's neuroma, left      Plan:  Patient was evaluated and treated and all questions answered. Discussed neuroma and treatment options with patient. Discussed believe the nerve ending where the nerve was cut is causing the pain for her.  Radiographs reviewed and discussed with patient.  Injection offered today. Patient deferred as had reactions to steroid in the past and not been helpful.  Discussed PRP and alcohol sclerosing injections and will  consider this.  Discussed padding and offloading today.  Prescription for lyrica  to try and see if this helps.  Continue topical as well.  Patient to return as needed for possible injection.     Asberry Failing, DPM

## 2024-01-09 ENCOUNTER — Encounter: Payer: Self-pay | Admitting: Podiatry

## 2024-01-10 ENCOUNTER — Other Ambulatory Visit: Payer: Self-pay | Admitting: Podiatry

## 2024-01-10 ENCOUNTER — Telehealth: Payer: Self-pay | Admitting: Podiatry

## 2024-01-10 MED ORDER — ALPRAZOLAM 0.5 MG PO TABS: 0.5000 mg | ORAL_TABLET | Freq: Once | ORAL | 0 refills | Status: AC

## 2024-01-10 NOTE — Telephone Encounter (Signed)
 Patient is scheduled for PRP injection 01/18/24 at 9:30

## 2024-01-11 ENCOUNTER — Telehealth: Payer: Self-pay | Admitting: Podiatry

## 2024-01-11 NOTE — Telephone Encounter (Signed)
 Patient is now scheduled correctly. Thank you

## 2024-01-11 NOTE — Telephone Encounter (Signed)
 Called and left a message asking for a return call. This patent next appointment needs to be scheduled for regular appointment for alcohol sclerosis.

## 2024-01-15 ENCOUNTER — Ambulatory Visit: Admitting: Podiatry

## 2024-01-15 ENCOUNTER — Encounter: Payer: Self-pay | Admitting: Podiatry

## 2024-01-15 DIAGNOSIS — G5762 Lesion of plantar nerve, left lower limb: Secondary | ICD-10-CM | POA: Diagnosis not present

## 2024-01-15 MED ORDER — ALPRAZOLAM 0.5 MG PO TABS: 0.5000 mg | ORAL_TABLET | ORAL | 0 refills | Status: AC

## 2024-01-15 NOTE — Progress Notes (Signed)
  Subjective:  Patient ID: Joanne [name], female    DOB: [birth year],   MRN: [id number]  Chief Complaint  Patient presents with   Neuroma    I'm going to do the Alcohol Sclerosis thing.  My surgery didn't work.    63 y.o. female presents for concern of continued pain in left foot. Here today for alcohol injection. . Denies any other pedal complaints. Denies n/v/f/c.   Past Medical History:  Diagnosis Date   Arthritis    Diverticulitis    Endometriosis    Abdominal hysterectomy   GERD (gastroesophageal reflux disease) 06/2009   EGD with small gastric and duodenal erosions/inflammation.   Hamstring injury 2014   Right hamstring tear   Hay fever    Migraines     Objective:  Physical Exam: Vascular: DP/PT pulses 2/4 bilateral. CFT <3 seconds. Normal hair growth on digits. No edema.  Skin. No lacerations or abrasions bilateral feet.  Musculoskeletal: MMT 5/5 bilateral lower extremities in DF, PF, Inversion and Eversion. Deceased ROM in DF of ankle joint. Tender more proximally in third interspace on the left with palpable swelling noted plantarly. Negative mulders click  Neurological: Sensation intact to light touch.   Assessment:   1. Morton's neuroma, left      Plan:  Patient was evaluated and treated and all questions answered. Discussed neuroma and treatment options with patient. Discussed believe the nerve ending where the nerve was cut is causing the pain for her.  Radiographs reviewed and discussed with patient.  Injection offered today. Alcohol sclerosing injection discussed. Procedure below.  2 more xanax sent into help with anxiety prior to next injections.  Return in 2 weeks for reinjection. Will plan for course of 3-7 injections   Procedure: Injection Tendon/Ligament Discussed alternatives, risks, complications and verbal consent was obtained.  Location: Left third interspace. . Skin Prep: Alcohol. Injectate: 1cc 0.5% marcaine plain, 1 cc 100% ethyl alcohol.   Disposition: Patient tolerated procedure well. Injection site dressed with a band-aid.  Post-injection care was discussed and return precautions discussed.      Asberry Failing, DPM

## 2024-01-18 ENCOUNTER — Ambulatory Visit

## 2024-01-28 ENCOUNTER — Ambulatory Visit: Admitting: Podiatry

## 2024-01-28 ENCOUNTER — Encounter: Payer: Self-pay | Admitting: Podiatry

## 2024-01-28 DIAGNOSIS — G5762 Lesion of plantar nerve, left lower limb: Secondary | ICD-10-CM | POA: Diagnosis not present

## 2024-01-28 NOTE — Progress Notes (Signed)
  Subjective:  Patient ID: Joanne [name], female    DOB: 12-24-1960,   MRN: [id number]  Chief Complaint  Patient presents with   Neuroma    Actually, it's doing better.  I'm hoping this one will make it even better.    63 y.o. female presents for follow-up of neuroma. Relates it is doing a bit better. Here today for alcohol injection. . Denies any other pedal complaints. Denies n/v/f/c.   Past Medical History:  Diagnosis Date   Arthritis    Diverticulitis    Endometriosis    Abdominal hysterectomy   GERD (gastroesophageal reflux disease) 06/2009   EGD with small gastric and duodenal erosions/inflammation.   Hamstring injury 2014   Right hamstring tear   Hay fever    Migraines     Objective:  Physical Exam: Vascular: DP/PT pulses 2/4 bilateral. CFT <3 seconds. Normal hair growth on digits. No edema.  Skin. No lacerations or abrasions bilateral feet.  Musculoskeletal: MMT 5/5 bilateral lower extremities in DF, PF, Inversion and Eversion. Deceased ROM in DF of ankle joint. Tender more proximally in third interspace on the left with palpable swelling noted plantarly. Negative mulders click  Neurological: Sensation intact to light touch.   Assessment:   1. Morton's neuroma, left      Plan:  Patient was evaluated and treated and all questions answered. Discussed neuroma and treatment options with patient. Discussed believe the nerve ending where the nerve was cut is causing the pain for her.  Radiographs reviewed and discussed with patient.  Injection offered today. Alcohol sclerosing injection discussed. Procedure below.  Xanax for next procedure appointment.  Return in 2 weeks for reinjection. Will plan for course of 3-7 injections   Procedure: Injection Tendon/Ligament Discussed alternatives, risks, complications and verbal consent was obtained.  Location: Left third interspace. . Skin Prep: Alcohol. Injectate: 1cc 0.5% marcaine plain, 1 cc 100% ethyl alcohol.   Disposition: Patient tolerated procedure well. Injection site dressed with a band-aid.  Post-injection care was discussed and return precautions discussed.      Asberry Failing, DPM

## 2024-02-11 ENCOUNTER — Other Ambulatory Visit: Payer: Self-pay | Admitting: Podiatry

## 2024-02-11 MED ORDER — PREGABALIN 50 MG PO CAPS: 50.0000 mg | ORAL_CAPSULE | Freq: Two times a day (BID) | ORAL | 2 refills | Status: DC

## 2024-02-19 ENCOUNTER — Ambulatory Visit: Admitting: Podiatry

## 2024-02-19 ENCOUNTER — Encounter: Payer: Self-pay | Admitting: Podiatry

## 2024-02-19 DIAGNOSIS — G5762 Lesion of plantar nerve, left lower limb: Secondary | ICD-10-CM | POA: Diagnosis not present

## 2024-02-19 NOTE — Progress Notes (Signed)
  Subjective:  Patient ID: Joanne [name], female    DOB: [birth year],   MRN: [id number]  Chief Complaint  Patient presents with   Neuroma    It's still doing better.  It's not right but it is better.  I can walk with certain shoes on, that didn't happen a few months ago.    63 y.o. female presents for follow-up of neuroma. Relates it is doing a bit better. Here today for alcohol injection. . Denies any other pedal complaints. Denies n/v/f/c.   Past Medical History:  Diagnosis Date   Arthritis    Diverticulitis    Endometriosis    Abdominal hysterectomy   GERD (gastroesophageal reflux disease) 06/2009   EGD with small gastric and duodenal erosions/inflammation.   Hamstring injury 2014   Right hamstring tear   Hay fever    Migraines     Objective:  Physical Exam: Vascular: DP/PT pulses 2/4 bilateral. CFT <3 seconds. Normal hair growth on digits. No edema.  Skin. No lacerations or abrasions bilateral feet.  Musculoskeletal: MMT 5/5 bilateral lower extremities in DF, PF, Inversion and Eversion. Deceased ROM in DF of ankle joint. Tender more proximally in third interspace on the left with palpable swelling noted plantarly. Negative mulders click  Neurological: Sensation intact to light touch.   Assessment:   1. Morton's neuroma, left       Plan:  Patient was evaluated and treated and all questions answered. Discussed neuroma and treatment options with patient. Discussed believe the nerve ending where the nerve was cut is causing the pain for her.  Radiographs reviewed and discussed with patient.  Injection offered today. Alcohol sclerosing injection discussed. Procedure below.  As she is improving we will plan on giving a break from alcohol sclerosing injections for the time being. Return as needed  Procedure: Injection Tendon/Ligament Discussed alternatives, risks, complications and verbal consent was obtained.  Location: Left third interspace. . Skin Prep:  Alcohol. Injectate: 1cc 0.5% marcaine plain, 1 cc 100% ethyl alcohol.  Disposition: Patient tolerated procedure well. Injection site dressed with a band-aid.  Post-injection care was discussed and return precautions discussed.      Asberry Failing, DPM

## 2024-05-06 ENCOUNTER — Other Ambulatory Visit: Payer: Self-pay | Admitting: Podiatry
